# Patient Record
Sex: Male | Born: 1935 | Race: White | Hispanic: No | Marital: Single | Smoking: Former smoker
Health system: Southern US, Community
[De-identification: ages and names within clinical notes are randomized; demographics above are authoritative.]

## PROBLEM LIST (undated history)

## (undated) DIAGNOSIS — E119 Type 2 diabetes mellitus without complications: Secondary | ICD-10-CM

## (undated) DIAGNOSIS — I10 Essential (primary) hypertension: Secondary | ICD-10-CM

## (undated) DIAGNOSIS — K529 Noninfective gastroenteritis and colitis, unspecified: Secondary | ICD-10-CM

## (undated) DIAGNOSIS — K259 Gastric ulcer, unspecified as acute or chronic, without hemorrhage or perforation: Secondary | ICD-10-CM

---

## 2005-12-26 ENCOUNTER — Emergency Department (HOSPITAL_COMMUNITY): Admission: EM | Admit: 2005-12-26 | Discharge: 2005-12-26 | Payer: Self-pay | Admitting: Emergency Medicine

## 2007-01-11 ENCOUNTER — Emergency Department (HOSPITAL_COMMUNITY): Admission: EM | Admit: 2007-01-11 | Discharge: 2007-01-11 | Payer: Self-pay | Admitting: Emergency Medicine

## 2009-01-03 ENCOUNTER — Emergency Department (HOSPITAL_COMMUNITY): Admission: EM | Admit: 2009-01-03 | Discharge: 2009-01-03 | Payer: Self-pay | Admitting: Emergency Medicine

## 2010-07-10 ENCOUNTER — Inpatient Hospital Stay (INDEPENDENT_AMBULATORY_CARE_PROVIDER_SITE_OTHER)
Admission: RE | Admit: 2010-07-10 | Discharge: 2010-07-10 | Disposition: A | Discharge status: Home / Self Care | Payer: Medicare Other | Source: Ambulatory Visit | Attending: Family Medicine | Admitting: Family Medicine

## 2010-07-10 DIAGNOSIS — H60399 Other infective otitis externa, unspecified ear: Secondary | ICD-10-CM

## 2010-07-10 DIAGNOSIS — J309 Allergic rhinitis, unspecified: Secondary | ICD-10-CM

## 2012-06-03 ENCOUNTER — Other Ambulatory Visit (HOSPITAL_COMMUNITY): Payer: Self-pay | Admitting: *Deleted

## 2012-06-04 ENCOUNTER — Encounter (HOSPITAL_COMMUNITY)
Admission: RE | Admit: 2012-06-04 | Discharge: 2012-06-04 | Disposition: A | Discharge status: Home / Self Care | Payer: Medicare Other | Source: Ambulatory Visit | Attending: Gastroenterology | Admitting: Gastroenterology

## 2012-06-04 DIAGNOSIS — M069 Rheumatoid arthritis, unspecified: Secondary | ICD-10-CM | POA: Insufficient documentation

## 2012-06-04 MED ORDER — DIPHENHYDRAMINE HCL 50 MG/ML IJ SOLN
INTRAMUSCULAR | Status: AC
  Administered 2012-06-04: 25 mg via INTRAVENOUS
  Filled 2012-06-04: qty 1

## 2012-06-04 MED ORDER — DIPHENHYDRAMINE HCL 50 MG/ML IJ SOLN
25.0000 mg | Freq: Once | INTRAMUSCULAR | Status: AC
  Administered 2012-06-04: 25 mg via INTRAVENOUS

## 2012-06-04 MED ORDER — ACETAMINOPHEN 325 MG PO TABS
650.0000 mg | ORAL_TABLET | Freq: Once | ORAL | Status: AC
  Administered 2012-06-04: 650 mg via ORAL

## 2012-06-04 MED ORDER — ACETAMINOPHEN 325 MG PO TABS
ORAL_TABLET | ORAL | Status: AC
  Administered 2012-06-04: 650 mg via ORAL
  Filled 2012-06-04: qty 2

## 2012-06-04 MED ORDER — SODIUM CHLORIDE 0.9 % IV SOLN
5.0000 mg/kg | Freq: Once | INTRAVENOUS | Status: AC
  Administered 2012-06-04: 400 mg via INTRAVENOUS
  Filled 2012-06-04: qty 40

## 2012-06-04 MED ORDER — SODIUM CHLORIDE 0.9 % IV SOLN
Freq: Once | INTRAVENOUS | Status: AC
  Administered 2012-06-04: 10:00:00 via INTRAVENOUS

## 2012-06-16 ENCOUNTER — Other Ambulatory Visit (HOSPITAL_COMMUNITY): Payer: Self-pay | Admitting: *Deleted

## 2012-06-16 ENCOUNTER — Other Ambulatory Visit (HOSPITAL_COMMUNITY): Payer: Self-pay | Admitting: Cardiology

## 2012-07-02 ENCOUNTER — Other Ambulatory Visit (HOSPITAL_COMMUNITY): Payer: Self-pay

## 2012-07-03 ENCOUNTER — Encounter (HOSPITAL_COMMUNITY)
Admission: RE | Admit: 2012-07-03 | Discharge: 2012-07-03 | Disposition: A | Discharge status: Home / Self Care | Payer: Medicare Other | Source: Ambulatory Visit | Attending: Gastroenterology | Admitting: Gastroenterology

## 2012-07-03 DIAGNOSIS — M069 Rheumatoid arthritis, unspecified: Secondary | ICD-10-CM | POA: Insufficient documentation

## 2012-07-03 LAB — COMPREHENSIVE METABOLIC PANEL
ALT: 22 U/L (ref 0–53)
Alkaline Phosphatase: 50 U/L (ref 39–117)
Chloride: 104 mEq/L (ref 96–112)
Creatinine, Ser: 1.06 mg/dL (ref 0.50–1.35)
Potassium: 4.1 mEq/L (ref 3.5–5.1)

## 2012-07-03 LAB — CBC WITH DIFFERENTIAL/PLATELET
Basophils Absolute: 0 10*3/uL (ref 0.0–0.1)
Eosinophils Absolute: 0 10*3/uL (ref 0.0–0.7)
HCT: 39 % (ref 39.0–52.0)
Lymphs Abs: 0.7 10*3/uL (ref 0.7–4.0)
MCH: 31.7 pg (ref 26.0–34.0)
MCV: 93.5 fL (ref 78.0–100.0)
Neutro Abs: 2.4 10*3/uL (ref 1.7–7.7)
Platelets: 229 10*3/uL (ref 150–400)
RDW: 14.7 % (ref 11.5–15.5)

## 2012-07-03 LAB — C-REACTIVE PROTEIN: CRP: <0.5 mg/dL — ABNORMAL LOW (ref <0.60)

## 2012-07-03 LAB — SEDIMENTATION RATE: Sed Rate: 0 mm/hr (ref 0–16)

## 2012-07-03 MED ORDER — ACETAMINOPHEN 325 MG PO TABS
650.0000 mg | ORAL_TABLET | Freq: Once | ORAL | Status: AC
  Administered 2012-07-03: 650 mg via ORAL

## 2012-07-03 MED ORDER — ACETAMINOPHEN 325 MG PO TABS
ORAL_TABLET | ORAL | Status: AC
  Administered 2012-07-03: 650 mg via ORAL
  Filled 2012-07-03: qty 2

## 2012-07-03 MED ORDER — SODIUM CHLORIDE 0.9 % IV SOLN
Freq: Once | INTRAVENOUS | Status: AC
  Administered 2012-07-03: 250 mL via INTRAVENOUS

## 2012-07-03 MED ORDER — DIPHENHYDRAMINE HCL 50 MG/ML IJ SOLN
INTRAMUSCULAR | Status: AC
  Administered 2012-07-03: 25 mg via INTRAVENOUS
  Filled 2012-07-03: qty 1

## 2012-07-03 MED ORDER — SODIUM CHLORIDE 0.9 % IV SOLN
5.0000 mg/kg | Freq: Once | INTRAVENOUS | Status: AC
  Administered 2012-07-03: 400 mg via INTRAVENOUS
  Filled 2012-07-03: qty 40

## 2012-07-03 MED ORDER — DIPHENHYDRAMINE HCL 50 MG/ML IJ SOLN
25.0000 mg | Freq: Once | INTRAMUSCULAR | Status: AC
  Administered 2012-07-03: 25 mg via INTRAVENOUS

## 2012-07-29 ENCOUNTER — Emergency Department (HOSPITAL_COMMUNITY): Payer: Medicare Other

## 2012-07-29 ENCOUNTER — Emergency Department (HOSPITAL_COMMUNITY)
Admission: EM | Admit: 2012-07-29 | Discharge: 2012-07-29 | Disposition: A | Discharge status: Home / Self Care | Payer: Medicare Other | Attending: Emergency Medicine | Admitting: Emergency Medicine

## 2012-07-29 ENCOUNTER — Encounter (HOSPITAL_COMMUNITY): Payer: Self-pay | Admitting: Emergency Medicine

## 2012-07-29 DIAGNOSIS — Z79899 Other long term (current) drug therapy: Secondary | ICD-10-CM | POA: Insufficient documentation

## 2012-07-29 DIAGNOSIS — M546 Pain in thoracic spine: Secondary | ICD-10-CM | POA: Insufficient documentation

## 2012-07-29 DIAGNOSIS — R109 Unspecified abdominal pain: Secondary | ICD-10-CM | POA: Insufficient documentation

## 2012-07-29 DIAGNOSIS — Z8719 Personal history of other diseases of the digestive system: Secondary | ICD-10-CM | POA: Insufficient documentation

## 2012-07-29 DIAGNOSIS — M549 Dorsalgia, unspecified: Secondary | ICD-10-CM

## 2012-07-29 DIAGNOSIS — M7989 Other specified soft tissue disorders: Secondary | ICD-10-CM

## 2012-07-29 HISTORY — DX: Gastric ulcer, unspecified as acute or chronic, without hemorrhage or perforation: K25.9

## 2012-07-29 LAB — CBC WITH DIFFERENTIAL/PLATELET
Basophils Absolute: 0 10*3/uL (ref 0.0–0.1)
Eosinophils Absolute: 0.1 10*3/uL (ref 0.0–0.7)
Hemoglobin: 13.3 g/dL (ref 13.0–17.0)
Lymphs Abs: 0.7 10*3/uL (ref 0.7–4.0)
MCHC: 35.3 g/dL (ref 30.0–36.0)
MCV: 93.3 fL (ref 78.0–100.0)
Monocytes Absolute: 0.6 10*3/uL (ref 0.1–1.0)
Monocytes Relative: 14 % — ABNORMAL HIGH (ref 3–12)
RBC: 4.04 MIL/uL — ABNORMAL LOW (ref 4.22–5.81)
WBC: 4.2 10*3/uL (ref 4.0–10.5)

## 2012-07-29 LAB — COMPREHENSIVE METABOLIC PANEL
ALT: 21 U/L (ref 0–53)
Albumin: 3.6 g/dL (ref 3.5–5.2)
Alkaline Phosphatase: 62 U/L (ref 39–117)
Creatinine, Ser: 1.21 mg/dL (ref 0.50–1.35)
Total Protein: 6.2 g/dL (ref 6.0–8.3)

## 2012-07-29 LAB — URINALYSIS, ROUTINE W REFLEX MICROSCOPIC
Bilirubin Urine: NEGATIVE
Hgb urine dipstick: NEGATIVE
Ketones, ur: NEGATIVE mg/dL
Protein, ur: NEGATIVE mg/dL
Urobilinogen, UA: 0.2 mg/dL (ref 0.0–1.0)

## 2012-07-29 LAB — D-DIMER, QUANTITATIVE: D-Dimer, Quant: 1.84 ug/mL-FEU — ABNORMAL HIGH (ref 0.00–0.48)

## 2012-07-29 LAB — URINE MICROSCOPIC-ADD ON

## 2012-07-29 LAB — LIPASE, BLOOD: Lipase: 44 U/L (ref 11–59)

## 2012-07-29 LAB — PRO B NATRIURETIC PEPTIDE: Pro B Natriuretic peptide (BNP): 114.3 pg/mL (ref 0–125)

## 2012-07-29 MED ORDER — HYDROCODONE-ACETAMINOPHEN 5-325 MG PO TABS: 2.0000 | ORAL_TABLET | ORAL | Status: DC | PRN

## 2012-07-29 MED ORDER — IOHEXOL 350 MG/ML SOLN
100.0000 mL | Freq: Once | INTRAVENOUS | Status: AC | PRN
  Administered 2012-07-29: 100 mL via INTRAVENOUS

## 2012-07-29 NOTE — ED Notes (Signed)
Pt alert and mentating appropriately. Pt given d/c teaching, prescription and follow up care instructions. Pt verbalizes understanding and has no further questions upon d/c. Pt ambulatory upon d/c. Pt leaving by self.

## 2012-07-29 NOTE — ED Notes (Signed)
Pt returned from vascular. Transporter states radiology states not ready for pt in CT at this time. Pt placed back on monitor. NAD noted at this time. Pt alert and mentating appropriately.

## 2012-07-29 NOTE — Progress Notes (Signed)
VASCULAR LAB PRELIMINARY  PRELIMINARY  PRELIMINARY  PRELIMINARY  Left lower extremity venous duplex completed.    Preliminary report:  Left:  No evidence of DVT, superficial thrombosis, or Baker's cyst.  Serenity Batley, RVT 07/29/2012, 11:47 AM

## 2012-07-29 NOTE — ED Notes (Signed)
Dr. Rancour at bedside. 

## 2012-07-29 NOTE — ED Provider Notes (Signed)
History    CSN: [id number] Arrival date & time 07/29/12  7654  First MD Initiated Contact with Patient 07/29/12 0845     Chief Complaint  Patient presents with  . Shoulder Pain  . Abdominal Pain   (Consider location/radiation/quality/duration/timing/severity/associated sxs/prior Treatment) HPI Comments: Patient presents with a four-day history of right paraspinal thoracic back pain. This is worse with movement of his right arm and worse with bending over. He denies any injury. He is wondering if he has an ulcer as he has had one in the past. He is pain with taking a deep breath. The pain radiates to his epigastrium when he moves a certain way. Denies any chest pain or shortness of breath. He has a history of colitis and is visiting from [place]. His last Remicade treatment with the end of June. Denies any cardiac history. No stents. Denies any shortness of breath or vomiting. Good PO intake and urine output.   The history is provided by the patient.   Past Medical History  Diagnosis Date  . Stomach ulcer    History reviewed. No pertinent past surgical history. History reviewed. No pertinent family history. History  Substance Use Topics  . Smoking status: Not on file  . Smokeless tobacco: Not on file  . Alcohol Use: Not on file    Review of Systems  Constitutional: Negative for activity change and appetite change.  HENT: Negative for congestion and rhinorrhea.   Respiratory: Negative for cough, chest tightness and shortness of breath.   Cardiovascular: Negative for chest pain.  Gastrointestinal: Positive for abdominal pain. Negative for nausea and vomiting.  Genitourinary: Negative for dysuria and hematuria.  Musculoskeletal: Positive for back pain.  Skin: Negative for rash.  Neurological: Negative for dizziness, weakness and headaches.  A complete 10 system review of systems was obtained and all systems are negative except as noted in the HPI and PMH.    Allergies   Keflex  Home Medications   Current Outpatient Rx  Name  Route  Sig  Dispense  Refill  . enalapril (VASOTEC) 5 MG tablet   Oral   Take 5 mg by mouth 2 (two) times daily.         . fenofibrate (TRICOR) 145 MG tablet   Oral   Take 145 mg by mouth daily.         . formoterol (FORADIL) 12 MCG capsule for inhaler   Inhalation   Place 12 mcg into inhaler and inhale daily.         Marland Kitchen glimepiride (AMARYL) 1 MG tablet   Oral   Take 1 mg by mouth daily with supper.         . levothyroxine (LEVOXYL) 75 MCG tablet   Oral   Take 75 mcg by mouth daily before breakfast.         . magnesium oxide (MAG-OX) 400 MG tablet   Oral   Take 400 mg by mouth daily.         . mesalamine (APRISO) 0.375 G 24 hr capsule   Oral   Take 375 mg by mouth 4 (four) times daily.         . metFORMIN (GLUCOPHAGE) 850 MG tablet   Oral   Take 850 mg by mouth 2 (two) times daily with a meal.         . montelukast (SINGULAIR) 10 MG tablet   Oral   Take 10 mg by mouth at bedtime.         Marland Kitchen  pioglitazone (ACTOS) 15 MG tablet   Oral   Take 15 mg by mouth every morning.         Marland Kitchen PRESCRIPTION MEDICATION   Topical   Apply 1 application topically daily as needed (Itching). Halbetasol propionate 0.05%         . simvastatin (ZOCOR) 20 MG tablet   Oral   Take 20 mg by mouth every evening.         . Testosterone (ANDROGEL TD)   Transdermal   Place 1 application onto the skin daily. Apply to shoulders         . HYDROcodone-acetaminophen (NORCO/VICODIN) 5-325 MG per tablet   Oral   Take 2 tablets by mouth every 4 (four) hours as needed for pain.   10 tablet   0    BP 153/80  Pulse 62  Temp(Src) 97.8 F (36.6 C) (Oral)  Resp 16  SpO2 97% Physical Exam  Constitutional: He is oriented to person, place, and time. He appears well-developed and well-nourished. No distress.  HENT:  Head: Normocephalic and atraumatic.  Mouth/Throat: Oropharynx is clear and moist. No oropharyngeal  exudate.  Eyes: Conjunctivae and EOM are normal. Pupils are equal, round, and reactive to light.  Neck: Normal range of motion. Neck supple.  Cardiovascular: Normal rate, regular rhythm and normal heart sounds.   No murmur heard. Pulmonary/Chest: Effort normal and breath sounds normal. No respiratory distress.  Abdominal: Soft. There is no tenderness. There is no rebound and no guarding.  Musculoskeletal: Normal range of motion. He exhibits tenderness. He exhibits no edema.  R paraspinal thoracic tenderness Worse with palpation, worse with R arm movement. Asymmetric LE swelling, L>R.  Intact DP and PT pulses  Neurological: He is alert and oriented to person, place, and time. No cranial nerve deficit. He exhibits normal muscle tone. Coordination normal.  CN 2-12 intact, no ataxia on finger to nose, no nystagmus, 5/5 strength throughout, no pronator drift, Romberg negative, normal gait.   Skin: Skin is warm.    ED Course  Procedures (including critical care time) Labs Reviewed  CBC WITH DIFFERENTIAL - Abnormal; Notable for the following:    RBC 4.04 (*)    HCT 37.7 (*)    Monocytes Relative 14 (*)    All other components within normal limits  COMPREHENSIVE METABOLIC PANEL - Abnormal; Notable for the following:    Glucose, Bld 250 (*)    BUN 26 (*)    GFR calc non Af Amer 57 (*)    GFR calc Af Amer 66 (*)    All other components within normal limits  D-DIMER, QUANTITATIVE - Abnormal; Notable for the following:    D-Dimer, Quant 1.84 (*)    All other components within normal limits  URINALYSIS, ROUTINE W REFLEX MICROSCOPIC - Abnormal; Notable for the following:    Glucose, UA >1000 (*)    All other components within normal limits  LIPASE, BLOOD  PRO B NATRIURETIC PEPTIDE  TROPONIN I  URINE MICROSCOPIC-ADD ON  TROPONIN I   Dg Abd Acute W/chest  07/29/2012   *RADIOLOGY REPORT*  Clinical Data: Right shoulder blade pain.  Leg swelling.  ACUTE ABDOMEN SERIES (ABDOMEN 2 VIEW & CHEST  1 VIEW)  Comparison: PA and lateral chest 01/11/2007.  Findings: Small focus of linear scar is seen in the right upper lobe and left lower lobe.  The lungs are otherwise clear.  No pneumothorax or pleural fluid is identified.  Two views of the abdomen show no free intraperitoneal  air.  The bowel gas pattern is unremarkable.  No abnormal abdominal calcification is seen.  Multilevel disc lumbar degenerative change in mild convex left scoliosis are noted.  IMPRESSION: No acute finding chest or abdomen.   Original Report Authenticated By: Orlean Patten, M.D.   Ct Angio Chest Aortic Dissect W &/or W/o  07/29/2012   *RADIOLOGY REPORT*  Clinical Data:  Back, epigastric and shoulder pain.  CT ANGIOGRAPHY CHEST, ABDOMEN AND PELVIS  Technique:  Multidetector CT imaging through the chest, abdomen and pelvis was performed using the standard protocol during bolus administration of intravenous contrast.  Multiplanar reconstructed images including MIPs were obtained and reviewed to evaluate the vascular anatomy.  Contrast: 169m OMNIPAQUE IOHEXOL 350 MG/ML SOLN  Comparison:  CTA of the chest on 12/26/2005.  CTA CHEST  Findings:  The thoracic aorta is of normal caliber and demonstrates no evidence of aneurysmal disease or dissection.  Proximal great vessels show normal branching pattern and patency.  There is some calcified plaque at the origin of the left subclavian artery which is not causing significant stenosis.  There is evidence of coronary atherosclerosis with calcified plaque noted at the level of the LAD and also a prominent diagonal branch. Calcified plaque is also suspected in the distribution of the RCA. However, the right coronary artery is blurred by motion artifact.  The pulmonary arteries are also well opacified on the study and there is no evidence of pulmonary embolism.  The heart size is at the upper limits of normal.  No pleural or pericardial fluid is identified.  Lungs show scattered areas of atelectasis  without focal consolidation, pulmonary nodule or edema.  No enlarged lymph nodes are seen.  The bony thorax shows diffuse osteophyte formation throughout the thoracic spine.  No fractures or bony lesions are seen.   Review of the MIP images confirms the above findings.  IMPRESSION:  1.  No evidence of aortic aneurysmal disease or dissection. 2.  No evidence of pulmonary embolism. 3.  Evidence of coronary atherosclerosis with calcified plaque identified by CT in the distribution of the LAD and right coronary artery.  CTA ABDOMEN AND PELVIS  Findings:  The abdominal aorta is well opacified and of normal caliber.  There is no evidence of aneurysmal disease or dissection. A mild amount of scattered calcified plaque is present.  There are separate origins of a common hepatic artery and splenic artery off of the aorta.  Both show normal patency.  The left gastric artery emanates from the proximal splenic artery.  The superior mesenteric artery shows normal patency.  A single right renal artery demonstrates normal patency.  Calcified plaque at the origin of the left renal artery causes approximately 30 - 40% stenosis.  The artery bifurcates almost immediately beyond its origin.  The inferior mesenteric artery is normally patent.  The iliac arteries are tortuous bilaterally and show no evidence of aneurysmal disease or significant stenosis.  The common femoral arteries are normally patent.  Femoral bifurcations also show normal patency.  The liver, gallbladder, pancreas, spleen and adrenal glands are unremarkable on arterial phase of contrast opacification.  There is a dominant posterior left upper pole renal cyst measuring 8.7 cm in greatest diameter.  The internal density is consistent with simple fluid.  Smaller lower pole cysts are present on the left.  There are several benign cysts on the right with the largest emanating from the posterior lower pole and measuring 5.2 cm in greatest diameter.  Hernia mesh is present  in  the left inguinal region without evidence of recurrent inguinal hernia.  No abnormal masses or enlarged lymph nodes are seen.  No abnormal fluid collections are identified. Diffuse diverticulosis of the colon is noted without evidence of diverticulitis.  The bladder is unremarkable.  The prostate gland is moderately enlarged with maximal diameter of 6.6 cm.  Diffuse degenerative changes are seen involving the spine without evidence of fracture or focal bony lesion.   Review of the MIP images confirms the above findings.  IMPRESSION:  1.  No evidence of abdominal aortic aneurysm or dissection. 2.  Mild narrowing of the proximal left renal artery which does not appear significant. 3.  Bilateral simple renal cysts including a dominant left renal cyst measuring nearly 9 cm. 4.  Left inguinal hernia mesh without evidence of recurrent hernia. 5.  Prostatic enlargement.   Original Report Authenticated By: Aletta Edouard, M.D.   Ct Angio Abd/pel W/ And/or W/o  07/29/2012   *RADIOLOGY REPORT*  Clinical Data:  Back, epigastric and shoulder pain.  CT ANGIOGRAPHY CHEST, ABDOMEN AND PELVIS  Technique:  Multidetector CT imaging through the chest, abdomen and pelvis was performed using the standard protocol during bolus administration of intravenous contrast.  Multiplanar reconstructed images including MIPs were obtained and reviewed to evaluate the vascular anatomy.  Contrast: 179m OMNIPAQUE IOHEXOL 350 MG/ML SOLN  Comparison:  CTA of the chest on 12/26/2005.  CTA CHEST  Findings:  The thoracic aorta is of normal caliber and demonstrates no evidence of aneurysmal disease or dissection.  Proximal great vessels show normal branching pattern and patency.  There is some calcified plaque at the origin of the left subclavian artery which is not causing significant stenosis.  There is evidence of coronary atherosclerosis with calcified plaque noted at the level of the LAD and also a prominent diagonal branch. Calcified plaque is  also suspected in the distribution of the RCA. However, the right coronary artery is blurred by motion artifact.  The pulmonary arteries are also well opacified on the study and there is no evidence of pulmonary embolism.  The heart size is at the upper limits of normal.  No pleural or pericardial fluid is identified.  Lungs show scattered areas of atelectasis without focal consolidation, pulmonary nodule or edema.  No enlarged lymph nodes are seen.  The bony thorax shows diffuse osteophyte formation throughout the thoracic spine.  No fractures or bony lesions are seen.   Review of the MIP images confirms the above findings.  IMPRESSION:  1.  No evidence of aortic aneurysmal disease or dissection. 2.  No evidence of pulmonary embolism. 3.  Evidence of coronary atherosclerosis with calcified plaque identified by CT in the distribution of the LAD and right coronary artery.  CTA ABDOMEN AND PELVIS  Findings:  The abdominal aorta is well opacified and of normal caliber.  There is no evidence of aneurysmal disease or dissection. A mild amount of scattered calcified plaque is present.  There are separate origins of a common hepatic artery and splenic artery off of the aorta.  Both show normal patency.  The left gastric artery emanates from the proximal splenic artery.  The superior mesenteric artery shows normal patency.  A single right renal artery demonstrates normal patency.  Calcified plaque at the origin of the left renal artery causes approximately 30 - 40% stenosis.  The artery bifurcates almost immediately beyond its origin.  The inferior mesenteric artery is normally patent.  The iliac arteries are tortuous bilaterally and show no evidence of  aneurysmal disease or significant stenosis.  The common femoral arteries are normally patent.  Femoral bifurcations also show normal patency.  The liver, gallbladder, pancreas, spleen and adrenal glands are unremarkable on arterial phase of contrast opacification.  There is  a dominant posterior left upper pole renal cyst measuring 8.7 cm in greatest diameter.  The internal density is consistent with simple fluid.  Smaller lower pole cysts are present on the left.  There are several benign cysts on the right with the largest emanating from the posterior lower pole and measuring 5.2 cm in greatest diameter.  Hernia mesh is present in the left inguinal region without evidence of recurrent inguinal hernia.  No abnormal masses or enlarged lymph nodes are seen.  No abnormal fluid collections are identified. Diffuse diverticulosis of the colon is noted without evidence of diverticulitis.  The bladder is unremarkable.  The prostate gland is moderately enlarged with maximal diameter of 6.6 cm.  Diffuse degenerative changes are seen involving the spine without evidence of fracture or focal bony lesion.   Review of the MIP images confirms the above findings.  IMPRESSION:  1.  No evidence of abdominal aortic aneurysm or dissection. 2.  Mild narrowing of the proximal left renal artery which does not appear significant. 3.  Bilateral simple renal cysts including a dominant left renal cyst measuring nearly 9 cm. 4.  Left inguinal hernia mesh without evidence of recurrent hernia. 5.  Prostatic enlargement.   Original Report Authenticated By: Aletta Edouard, M.D.   1. Back pain     MDM  3 days of right paraspinal back pain that is worse with movement worse with deep breathing. Denies injury.  Asymmetric left lower extremity with swelling.  Patient states this is chronic from previous surgeries. Abdomen is soft and nontender. Equal pulses throughout, no neuro deficifts Right paraspinal back pain is worse with palpation worse with right arm movement. Equal grip strength bilaterally. Equal radial pulses.  EKG nonischemic.  CXR negative.  Troponin negative.  No evidence of free air or obstruction. Doppler negative for DVT. Given location of back pain, concern for dissection or PE given recent  travel.  Equal peripheral pulses, no neuro deficits.   There is no evidence of PE or aortic dissection on imaging. Delta troponin negative. Patient refusing pain medication. Suspect musculoskeletal back pain. Will treat with pain meds.  No NSAIDs given history of ulcer. Return precautions discussed.   Date: 07/29/2012  Rate: 80  Rhythm: normal sinus rhythm  QRS Axis: normal  Intervals: normal  ST/T Wave abnormalities: normal  Conduction Disutrbances:none  Narrative Interpretation:   Old EKG Reviewed: none available   BP 153/80  Pulse 62  Temp(Src) 97.8 F (36.6 C) (Oral)  Resp 16  SpO2 97%      Ezequiel Essex, MD 07/29/12 1541

## 2012-07-29 NOTE — ED Notes (Signed)
Pt states pain started in epigastric area where he has had ulcer before last Friday. Pt states pain started in right shoulder. Pt states was taking Protonix and thought it was helping. Pt states woke up this morning and had trouble taking deep breath. Pt states pain increase with bending over. Pt denies n/v/d.

## 2012-12-18 ENCOUNTER — Other Ambulatory Visit (HOSPITAL_COMMUNITY): Payer: Self-pay | Admitting: *Deleted

## 2012-12-21 ENCOUNTER — Encounter (HOSPITAL_COMMUNITY)
Admission: RE | Admit: 2012-12-21 | Discharge: 2012-12-21 | Disposition: A | Discharge status: Home / Self Care | Payer: Medicare Other | Source: Ambulatory Visit | Attending: Gastroenterology | Admitting: Gastroenterology

## 2012-12-21 DIAGNOSIS — K5190 Ulcerative colitis, unspecified, without complications: Secondary | ICD-10-CM | POA: Insufficient documentation

## 2012-12-21 MED ORDER — DIPHENHYDRAMINE HCL 50 MG/ML IJ SOLN: 25.0000 mg | INTRAMUSCULAR | Status: DC

## 2012-12-21 MED ORDER — SODIUM CHLORIDE 0.9 % IV SOLN
INTRAVENOUS | Status: DC
  Administered 2012-12-21: 250 mL via INTRAVENOUS

## 2012-12-21 MED ORDER — ACETAMINOPHEN 325 MG PO TABS
ORAL_TABLET | ORAL | Status: AC
  Administered 2012-12-21: 650 mg via ORAL
  Filled 2012-12-21: qty 2

## 2012-12-21 MED ORDER — SODIUM CHLORIDE 0.9 % IV SOLN
5.0000 mg/kg | INTRAVENOUS | Status: DC
  Administered 2012-12-21: 400 mg via INTRAVENOUS
  Filled 2012-12-21: qty 40

## 2012-12-21 MED ORDER — DIPHENHYDRAMINE HCL 50 MG/ML IJ SOLN
INTRAMUSCULAR | Status: AC
  Administered 2012-12-21: 25 mg via INTRAVENOUS
  Filled 2012-12-21: qty 1

## 2012-12-21 MED ORDER — ACETAMINOPHEN 325 MG PO TABS
650.0000 mg | ORAL_TABLET | ORAL | Status: DC
Start: 2012-12-21 — End: 2012-12-22

## 2012-12-31 ENCOUNTER — Encounter (HOSPITAL_COMMUNITY): Payer: Self-pay | Admitting: Emergency Medicine

## 2012-12-31 ENCOUNTER — Emergency Department (HOSPITAL_COMMUNITY)
Admission: EM | Admit: 2012-12-31 | Discharge: 2012-12-31 | Disposition: A | Discharge status: Home Health | Payer: Medicare Other | Attending: Emergency Medicine | Admitting: Emergency Medicine

## 2012-12-31 DIAGNOSIS — J019 Acute sinusitis, unspecified: Secondary | ICD-10-CM | POA: Insufficient documentation

## 2012-12-31 DIAGNOSIS — Z9109 Other allergy status, other than to drugs and biological substances: Secondary | ICD-10-CM | POA: Insufficient documentation

## 2012-12-31 DIAGNOSIS — E119 Type 2 diabetes mellitus without complications: Secondary | ICD-10-CM | POA: Insufficient documentation

## 2012-12-31 DIAGNOSIS — Z79899 Other long term (current) drug therapy: Secondary | ICD-10-CM | POA: Insufficient documentation

## 2012-12-31 DIAGNOSIS — Z8719 Personal history of other diseases of the digestive system: Secondary | ICD-10-CM | POA: Insufficient documentation

## 2012-12-31 DIAGNOSIS — J309 Allergic rhinitis, unspecified: Secondary | ICD-10-CM | POA: Insufficient documentation

## 2012-12-31 DIAGNOSIS — J209 Acute bronchitis, unspecified: Secondary | ICD-10-CM | POA: Insufficient documentation

## 2012-12-31 DIAGNOSIS — Z881 Allergy status to other antibiotic agents status: Secondary | ICD-10-CM | POA: Insufficient documentation

## 2012-12-31 DIAGNOSIS — I10 Essential (primary) hypertension: Secondary | ICD-10-CM | POA: Insufficient documentation

## 2012-12-31 DIAGNOSIS — J40 Bronchitis, not specified as acute or chronic: Secondary | ICD-10-CM

## 2012-12-31 DIAGNOSIS — J329 Chronic sinusitis, unspecified: Secondary | ICD-10-CM

## 2012-12-31 HISTORY — DX: Noninfective gastroenteritis and colitis, unspecified: K52.9

## 2012-12-31 MED ORDER — HYDROCOD POLST-CHLORPHEN POLST 10-8 MG/5ML PO LQCR: 5.0000 mL | Freq: Two times a day (BID) | ORAL | Status: DC | PRN

## 2012-12-31 MED ORDER — AZITHROMYCIN 250 MG PO TABS: 250.0000 mg | ORAL_TABLET | Freq: Every day | ORAL | Status: DC

## 2012-12-31 NOTE — ED Provider Notes (Signed)
CSN: [id number]     Arrival date & time 12/31/12  4098 History   First MD Initiated Contact with Patient 12/31/12 0745     Chief Complaint  Patient presents with  . Nasal Congestion   (Consider location/radiation/quality/duration/timing/severity/associated sxs/prior Treatment) HPI Comments: Patient is 77 year old male with PMHx significant for HTN, DM, allergies who presents to the ED with a 7 day history of sinus congestion, nasal congestion, cough with yellow sputum production.  He states that when he leans over the pain in his face worsens.  He denies fever, chills, headache, sore throat, chest pain, shortness of breath, nausea, vomiting, palpitations.  He reports no recent sick contacts.  Patient is a 77 y.o. male presenting with cough. The history is provided by the patient. No language interpreter was used.  Cough Cough characteristics:  Productive Sputum characteristics:  Yellow Severity:  Moderate Onset quality:  Gradual Duration:  7 days Timing:  Constant Chronicity:  New Smoker: no   Context: upper respiratory infection   Context: not animal exposure, not exposure to allergens, not sick contacts, not smoke exposure, not weather changes and not with activity   Relieved by:  Nothing Worsened by:  Nothing tried Ineffective treatments:  None tried Associated symptoms: rhinorrhea and sinus congestion   Associated symptoms: no chest pain, no chills, no ear pain, no eye discharge, no fever, no headaches, no myalgias, no shortness of breath, no sore throat and no wheezing     Past Medical History  Diagnosis Date  . Stomach ulcer   . Colitis    History reviewed. No pertinent past surgical history. No family history on file. History  Substance Use Topics  . Smoking status: Never Smoker   . Smokeless tobacco: Not on file  . Alcohol Use: 1.2 oz/week    2 Shots of liquor per week    Review of Systems  Constitutional: Negative for fever and chills.  HENT: Positive for  rhinorrhea. Negative for ear pain and sore throat.   Eyes: Negative for discharge.  Respiratory: Positive for cough. Negative for shortness of breath and wheezing.   Cardiovascular: Negative for chest pain.  Musculoskeletal: Negative for myalgias.  Neurological: Negative for headaches.  All other systems reviewed and are negative.    Allergies  Keflex  Home Medications   Current Outpatient Rx  Name  Route  Sig  Dispense  Refill  . enalapril (VASOTEC) 5 MG tablet   Oral   Take 5 mg by mouth 2 (two) times daily.         . fenofibrate (TRICOR) 145 MG tablet   Oral   Take 145 mg by mouth daily.         . formoterol (FORADIL) 12 MCG capsule for inhaler   Inhalation   Place 12 mcg into inhaler and inhale daily.         Marland Kitchen glimepiride (AMARYL) 1 MG tablet   Oral   Take 1 mg by mouth daily with supper.         Marland Kitchen HYDROcodone-acetaminophen (NORCO/VICODIN) 5-325 MG per tablet   Oral   Take 2 tablets by mouth every 4 (four) hours as needed for pain.   10 tablet   0   . levothyroxine (LEVOXYL) 75 MCG tablet   Oral   Take 75 mcg by mouth daily before breakfast.         . magnesium oxide (MAG-OX) 400 MG tablet   Oral   Take 400 mg by mouth daily.         Marland Kitchen  mesalamine (APRISO) 0.375 G 24 hr capsule   Oral   Take 375 mg by mouth 4 (four) times daily.         . metFORMIN (GLUCOPHAGE) 850 MG tablet   Oral   Take 850 mg by mouth 2 (two) times daily with a meal.         . montelukast (SINGULAIR) 10 MG tablet   Oral   Take 10 mg by mouth at bedtime.         . pioglitazone (ACTOS) 15 MG tablet   Oral   Take 15 mg by mouth every morning.         Marland Kitchen PRESCRIPTION MEDICATION   Topical   Apply 1 application topically daily as needed (Itching). Halbetasol propionate 0.05%         . simvastatin (ZOCOR) 20 MG tablet   Oral   Take 20 mg by mouth every evening.         . Testosterone (ANDROGEL TD)   Transdermal   Place 1 application onto the skin daily.  Apply to shoulders          BP 136/78  Pulse 71  Temp(Src) 98 F (36.7 C) (Oral)  Resp 18  Ht 5' 7"  (1.702 m)  Wt 200 lb (90.719 kg)  BMI 31.32 kg/m2 Physical Exam  Nursing note and vitals reviewed. Constitutional: He is oriented to person, place, and time. He appears well-developed and well-nourished. No distress.  HENT:  Head: Normocephalic and atraumatic.  Right Ear: External ear normal.  Left Ear: External ear normal.  Mouth/Throat: Oropharynx is clear and moist. No oropharyngeal exudate.  Boggy nasal mucosa, maxillary sinus ttp, frontal sinus non tender  Eyes: Conjunctivae are normal. Pupils are equal, round, and reactive to light. No scleral icterus.  Neck: Normal range of motion. Neck supple.  Cardiovascular: Normal rate, regular rhythm and normal heart sounds.  Exam reveals no gallop and no friction rub.   No murmur heard. Pulmonary/Chest: Effort normal and breath sounds normal. No respiratory distress. He has no wheezes. He has no rales. He exhibits no tenderness.  Abdominal: Soft. Bowel sounds are normal. He exhibits no distension. There is no tenderness.  Musculoskeletal: Normal range of motion. He exhibits no edema and no tenderness.  Lymphadenopathy:    He has no cervical adenopathy.  Neurological: He is alert and oriented to person, place, and time. He exhibits normal muscle tone. Coordination normal.  Skin: Skin is warm and dry. No rash noted. No erythema. No pallor.  Psychiatric: He has a normal mood and affect. His behavior is normal. Judgment and thought content normal.    ED Course  Procedures (including critical care time) Labs Review Labs Reviewed - No data to display Imaging Review No results found.  EKG Interpretation   None       MDM  Bronchitis Sinusitis  Patient here with nasal congestion, cough with yellow sputum production, history of bronchitis - no alarming signs to suggest CAD, respiratory distress.  Reports recent cardiac work up  which was negative.  Will place on zithromycin with cough medication.   Idalia Needle Joelyn Oms, [place] 12/31/12 [phone]

## 2012-12-31 NOTE — ED Notes (Signed)
Pt has had congestion x 7 days

## 2012-12-31 NOTE — ED Notes (Signed)
MD at bedside. 

## 2013-01-01 NOTE — ED Provider Notes (Signed)
Medical screening examination/treatment/procedure(s) were conducted as a shared visit with non-physician practitioner(s) and myself.  I personally evaluated the patient during the encounter.  1 week of cough, congestion, nasal congestion. No fever, chest pain, SOB.  EKG Interpretation   None        Ezequiel Essex, MD 01/01/13 1010

## 2013-01-05 ENCOUNTER — Emergency Department (HOSPITAL_COMMUNITY)
Admission: EM | Admit: 2013-01-05 | Discharge: 2013-01-05 | Disposition: A | Discharge status: Home / Self Care | Payer: Medicare Other | Attending: Emergency Medicine | Admitting: Emergency Medicine

## 2013-01-05 ENCOUNTER — Encounter (HOSPITAL_COMMUNITY): Payer: Self-pay | Admitting: Emergency Medicine

## 2013-01-05 DIAGNOSIS — R0981 Nasal congestion: Secondary | ICD-10-CM

## 2013-01-05 DIAGNOSIS — R062 Wheezing: Secondary | ICD-10-CM | POA: Insufficient documentation

## 2013-01-05 DIAGNOSIS — J069 Acute upper respiratory infection, unspecified: Secondary | ICD-10-CM | POA: Insufficient documentation

## 2013-01-05 DIAGNOSIS — Z79899 Other long term (current) drug therapy: Secondary | ICD-10-CM | POA: Insufficient documentation

## 2013-01-05 DIAGNOSIS — Z87891 Personal history of nicotine dependence: Secondary | ICD-10-CM | POA: Insufficient documentation

## 2013-01-05 DIAGNOSIS — Z881 Allergy status to other antibiotic agents status: Secondary | ICD-10-CM | POA: Insufficient documentation

## 2013-01-05 DIAGNOSIS — Z8719 Personal history of other diseases of the digestive system: Secondary | ICD-10-CM | POA: Insufficient documentation

## 2013-01-05 MED ORDER — PREDNISONE 20 MG PO TABS: 40.0000 mg | ORAL_TABLET | Freq: Every day | ORAL | Status: DC

## 2013-01-05 MED ORDER — FEXOFENADINE-PSEUDOEPHED ER 60-120 MG PO TB12: 1.0000 | ORAL_TABLET | Freq: Two times a day (BID) | ORAL | Status: DC

## 2013-01-05 MED ORDER — LORATADINE 10 MG PO TABS: 10.0000 mg | ORAL_TABLET | Freq: Every day | ORAL | Status: DC | PRN

## 2013-01-05 MED ORDER — PREDNISONE 20 MG PO TABS
40.0000 mg | ORAL_TABLET | Freq: Once | ORAL | Status: AC
  Administered 2013-01-05: 40 mg via ORAL
  Filled 2013-01-05: qty 2

## 2013-01-05 NOTE — ED Provider Notes (Signed)
CSN: [id number]     Arrival date & time 01/05/13  8127 History   First MD Initiated Contact with Patient 01/05/13 1111     Chief Complaint  Patient presents with  . Fatigue   (Consider location/radiation/quality/duration/timing/severity/associated sxs/prior Treatment) HPI  77 year old male with a facial congestion and generalized fatigue. Symptoms have been ongoing for well over a week. He is evaluating the emergency room on December 25. He is a started on azithromycin which he is finished. He is additionally given medication for cough. His cough has improved, but his other symptoms have not. As a sensation of facial pressure that he cannot breathe through his nose. Patrick Carson is nonproductive. He denies any shortness of breath, but he does feel like he is wheezing and "whistling" when he sleeps at night. No fevers or chills. No nausea or vomiting. No urinary complaints.  Past Medical History  Diagnosis Date  . Stomach ulcer   . Colitis    History reviewed. No pertinent past surgical history. No family history on file. History  Substance Use Topics  . Smoking status: Former Research scientist (life sciences)  . Smokeless tobacco: Not on file  . Alcohol Use: 1.2 oz/week    2 Shots of liquor per week    Review of Systems  All systems reviewed and negative, other than as noted in HPI.   Allergies  Keflex  Home Medications   Current Outpatient Rx  Name  Route  Sig  Dispense  Refill  . chlorpheniramine-HYDROcodone (TUSSIONEX PENNKINETIC ER) 10-8 MG/5ML LQCR   Oral   Take 5 mLs by mouth every 12 (twelve) hours as needed for cough.   115 mL   0   . cycloSPORINE (RESTASIS) 0.05 % ophthalmic emulsion   Both Eyes   Place 1 drop into both eyes 2 (two) times daily as needed (dryness).         . enalapril (VASOTEC) 5 MG tablet   Oral   Take 5 mg by mouth 2 (two) times daily.         . fenofibrate (TRICOR) 145 MG tablet   Oral   Take 145 mg by mouth daily.         . formoterol (FORADIL) 12 MCG  capsule for inhaler   Inhalation   Place 12 mcg into inhaler and inhale daily.         Marland Kitchen glimepiride (AMARYL) 1 MG tablet   Oral   Take 1 mg by mouth daily with supper.         . halobetasol (ULTRAVATE) 0.05 % cream   Topical   Apply 1 application topically 2 (two) times daily as needed (itching).         Marland Kitchen levothyroxine (LEVOXYL) 75 MCG tablet   Oral   Take 75 mcg by mouth daily before breakfast.         . metFORMIN (GLUCOPHAGE) 850 MG tablet   Oral   Take 850 mg by mouth 2 (two) times daily with a meal.         . montelukast (SINGULAIR) 10 MG tablet   Oral   Take 10 mg by mouth at bedtime.         . naproxen sodium (ANAPROX) 220 MG tablet   Oral   Take 220 mg by mouth daily as needed (pain).         . pioglitazone (ACTOS) 15 MG tablet   Oral   Take 15 mg by mouth every morning.         Marland Kitchen  simvastatin (ZOCOR) 20 MG tablet   Oral   Take 20 mg by mouth every evening.         . tamsulosin (FLOMAX) 0.4 MG CAPS capsule   Oral   Take 0.4 mg by mouth daily.         . Testosterone (ANDROGEL TD)   Transdermal   Place 1 application onto the skin daily. Apply to shoulders         . triamcinolone cream (KENALOG) 0.1 %   Topical   Apply 1 application topically 2 (two) times daily as needed (rash).          BP 146/71  Pulse 75  Temp(Src) 97.8 F (36.6 C) (Oral)  Resp 15  SpO2 96% Physical Exam  Nursing note and vitals reviewed. Constitutional: He appears well-developed and well-nourished. No distress.  HENT:  Head: Normocephalic and atraumatic.  Right Ear: External ear normal.  Left Ear: External ear normal.  Mouth/Throat: Oropharynx is clear and moist.  Boggy nasal mucosa  Eyes: Conjunctivae and EOM are normal. Pupils are equal, round, and reactive to light. Right eye exhibits no discharge. Left eye exhibits no discharge.  Neck: Neck supple.  Cardiovascular: Normal rate, regular rhythm and normal heart sounds.  Exam reveals no gallop and no  friction rub.   No murmur heard. Pulmonary/Chest: Effort normal and breath sounds normal. No respiratory distress.  Abdominal: Soft. He exhibits no distension. There is no tenderness.  Musculoskeletal: He exhibits no edema and no tenderness.  Neurological: He is alert.  Skin: Skin is warm and dry. He is not diaphoretic.  Psychiatric: He has a normal mood and affect. His behavior is normal. Thought content normal.    ED Course  Procedures (including critical care time) Labs Review Labs Reviewed - No data to display Imaging Review No results found.  EKG Interpretation   None       MDM   1. Sinus congestion   2. Viral URI with cough    77 year old male with congestion and generalized fatigue. Symptoms are consistent with a viral upper respiratory infection. His cough has improved since his last evaluation but his other symptoms have persisted. He sounds clear to me on exam has no increased work of breathing, he does endorse that he sounds wheezy at night though. The day course of steroids is reasonable to try. He seems frustrated that his symptoms have lingered on for so long. Discussed with patient that this is typical though. Will try decongestants for potential further symptomatic relief. Return precautions were discussed. Outpatient followup otherwise.    Virgel Manifold, MD 01/10/13 1745

## 2013-01-05 NOTE — ED Notes (Signed)
Pt comfortable with d/c and f/u instructions. Prescriptions x2.

## 2013-01-05 NOTE — ED Notes (Signed)
Dr. Kohut at bedside 

## 2013-01-05 NOTE — ED Notes (Signed)
Pt was here on christmas and placed on zpack and cough medication.  Cough has improved but continues to have headache and fatigue.  No chest pain or sob.  Pt was diagnosed with bronchitis

## 2013-06-11 ENCOUNTER — Other Ambulatory Visit (HOSPITAL_COMMUNITY): Payer: Self-pay | Admitting: *Deleted

## 2013-06-14 ENCOUNTER — Encounter (HOSPITAL_COMMUNITY)
Admission: RE | Admit: 2013-06-14 | Discharge: 2013-06-14 | Disposition: A | Discharge status: Home / Self Care | Payer: Medicare Other | Source: Ambulatory Visit | Attending: Gastroenterology | Admitting: Gastroenterology

## 2013-06-14 DIAGNOSIS — K5190 Ulcerative colitis, unspecified, without complications: Secondary | ICD-10-CM | POA: Diagnosis not present

## 2013-06-14 MED ORDER — ACETAMINOPHEN 325 MG PO TABS
ORAL_TABLET | ORAL | Status: AC
  Filled 2013-06-14: qty 2

## 2013-06-14 MED ORDER — DIPHENHYDRAMINE HCL 50 MG/ML IJ SOLN
INTRAMUSCULAR | Status: AC
  Filled 2013-06-14: qty 1

## 2013-06-14 MED ORDER — ACETAMINOPHEN 325 MG PO TABS
650.0000 mg | ORAL_TABLET | ORAL | Status: DC
  Administered 2013-06-14: 650 mg via ORAL

## 2013-06-14 MED ORDER — SODIUM CHLORIDE 0.9 % IV SOLN
5.0000 mg/kg | INTRAVENOUS | Status: DC
  Administered 2013-06-14: 500 mg via INTRAVENOUS
  Filled 2013-06-14: qty 50

## 2013-06-14 MED ORDER — SODIUM CHLORIDE 0.9 % IV SOLN
INTRAVENOUS | Status: DC
  Administered 2013-06-14: 09:00:00 via INTRAVENOUS

## 2013-06-14 MED ORDER — DIPHENHYDRAMINE HCL 50 MG/ML IJ SOLN
25.0000 mg | INTRAMUSCULAR | Status: DC
  Administered 2013-06-14: 25 mg via INTRAVENOUS

## 2013-12-14 ENCOUNTER — Other Ambulatory Visit (HOSPITAL_COMMUNITY): Payer: Self-pay | Admitting: *Deleted

## 2013-12-15 ENCOUNTER — Ambulatory Visit (HOSPITAL_COMMUNITY)
Admission: RE | Admit: 2013-12-15 | Discharge: 2013-12-15 | Disposition: A | Discharge status: Home / Self Care | Payer: Medicare Other | Source: Ambulatory Visit | Attending: Gastroenterology | Admitting: Gastroenterology

## 2013-12-15 DIAGNOSIS — K5190 Ulcerative colitis, unspecified, without complications: Secondary | ICD-10-CM | POA: Diagnosis not present

## 2013-12-15 LAB — COMPREHENSIVE METABOLIC PANEL
ALBUMIN: 3.6 g/dL (ref 3.5–5.2)
ALK PHOS: 50 U/L (ref 39–117)
ALT: 23 U/L (ref 0–53)
ANION GAP: 15 (ref 5–15)
AST: 34 U/L (ref 0–37)
BUN: 30 mg/dL — AB (ref 6–23)
CALCIUM: 9.6 mg/dL (ref 8.4–10.5)
CO2: 19 mEq/L (ref 19–32)
Chloride: 105 mEq/L (ref 96–112)
Creatinine, Ser: 1.06 mg/dL (ref 0.50–1.35)
GFR calc non Af Amer: 67 mL/min — ABNORMAL LOW (ref >90)
GFR, EST AFRICAN AMERICAN: 77 mL/min — AB (ref >90)
Glucose, Bld: 181 mg/dL — ABNORMAL HIGH (ref 70–99)
Potassium: 5.1 mEq/L (ref 3.7–5.3)
SODIUM: 139 meq/L (ref 137–147)
TOTAL PROTEIN: 7 g/dL (ref 6.0–8.3)
Total Bilirubin: 0.3 mg/dL (ref 0.3–1.2)

## 2013-12-15 LAB — CBC
HCT: 38.4 % — ABNORMAL LOW (ref 39.0–52.0)
Hemoglobin: 13.2 g/dL (ref 13.0–17.0)
MCH: 32.1 pg (ref 26.0–34.0)
MCHC: 34.4 g/dL (ref 30.0–36.0)
MCV: 93.4 fL (ref 78.0–100.0)
Platelets: 248 10*3/uL (ref 150–400)
RBC: 4.11 MIL/uL — ABNORMAL LOW (ref 4.22–5.81)
RDW: 14.1 % (ref 11.5–15.5)
WBC: 4.2 10*3/uL (ref 4.0–10.5)

## 2013-12-15 LAB — DIFFERENTIAL
BASOS PCT: 1 % (ref 0–1)
Basophils Absolute: 0 10*3/uL (ref 0.0–0.1)
EOS ABS: 0.1 10*3/uL (ref 0.0–0.7)
Eosinophils Relative: 3 % (ref 0–5)
Lymphocytes Relative: 13 % (ref 12–46)
Lymphs Abs: 0.5 10*3/uL — ABNORMAL LOW (ref 0.7–4.0)
MONO ABS: 0.6 10*3/uL (ref 0.1–1.0)
Monocytes Relative: 14 % — ABNORMAL HIGH (ref 3–12)
NEUTROS ABS: 2.9 10*3/uL (ref 1.7–7.7)
Neutrophils Relative %: 69 % (ref 43–77)

## 2013-12-15 LAB — SEDIMENTATION RATE: Sed Rate: 24 mm/hr — ABNORMAL HIGH (ref 0–16)

## 2013-12-15 LAB — C-REACTIVE PROTEIN: CRP: 1.1 mg/dL — AB (ref <0.60)

## 2013-12-15 MED ORDER — DIPHENHYDRAMINE HCL 50 MG/ML IJ SOLN
25.0000 mg | INTRAMUSCULAR | Status: DC
  Administered 2013-12-15: 25 mg via INTRAVENOUS

## 2013-12-15 MED ORDER — ACETAMINOPHEN 325 MG PO TABS
650.0000 mg | ORAL_TABLET | ORAL | Status: DC
Start: 2013-12-15 — End: 2013-12-16
  Administered 2013-12-15: 650 mg via ORAL

## 2013-12-15 MED ORDER — SODIUM CHLORIDE 0.9 % IV SOLN
5.0000 mg/kg | INTRAVENOUS | Status: DC
  Administered 2013-12-15: 500 mg via INTRAVENOUS
  Filled 2013-12-15: qty 50

## 2013-12-15 MED ORDER — SODIUM CHLORIDE 0.9 % IV SOLN
INTRAVENOUS | Status: DC
  Administered 2013-12-15: 250 mL via INTRAVENOUS

## 2013-12-15 MED ORDER — DIPHENHYDRAMINE HCL 50 MG/ML IJ SOLN
INTRAMUSCULAR | Status: AC
  Administered 2013-12-15: 25 mg via INTRAVENOUS
  Filled 2013-12-15: qty 1

## 2013-12-15 MED ORDER — ACETAMINOPHEN 325 MG PO TABS
ORAL_TABLET | ORAL | Status: AC
  Administered 2013-12-15: 650 mg via ORAL
  Filled 2013-12-15: qty 2

## 2014-06-23 ENCOUNTER — Encounter (HOSPITAL_COMMUNITY): Payer: Self-pay | Admitting: Physical Medicine and Rehabilitation

## 2014-06-23 ENCOUNTER — Emergency Department (HOSPITAL_COMMUNITY)
Admission: EM | Admit: 2014-06-23 | Discharge: 2014-06-23 | Disposition: A | Discharge status: Home / Self Care | Payer: Medicare Other | Attending: Emergency Medicine | Admitting: Emergency Medicine

## 2014-06-23 DIAGNOSIS — M545 Low back pain: Secondary | ICD-10-CM | POA: Diagnosis present

## 2014-06-23 DIAGNOSIS — Z79899 Other long term (current) drug therapy: Secondary | ICD-10-CM | POA: Diagnosis not present

## 2014-06-23 DIAGNOSIS — M5431 Sciatica, right side: Secondary | ICD-10-CM | POA: Diagnosis not present

## 2014-06-23 DIAGNOSIS — Z8719 Personal history of other diseases of the digestive system: Secondary | ICD-10-CM | POA: Diagnosis not present

## 2014-06-23 DIAGNOSIS — Z87891 Personal history of nicotine dependence: Secondary | ICD-10-CM | POA: Insufficient documentation

## 2014-06-23 MED ORDER — DIAZEPAM 5 MG PO TABS
5.0000 mg | ORAL_TABLET | Freq: Once | ORAL | Status: AC
  Administered 2014-06-23: 5 mg via ORAL
  Filled 2014-06-23: qty 1

## 2014-06-23 MED ORDER — DIAZEPAM 5 MG PO TABS: 5.0000 mg | ORAL_TABLET | Freq: Four times a day (QID) | ORAL | Status: DC | PRN

## 2014-06-23 NOTE — ED Provider Notes (Signed)
CSN: [id number]     Arrival date & time 06/23/14  1400 History   First MD Initiated Contact with Patient 06/23/14 1501     Chief Complaint  Patient presents with  . Back Pain     (Consider location/radiation/quality/duration/timing/severity/associated sxs/prior Treatment) Patient is a 79 y.o. male presenting with back pain.  Back Pain Location:  Lumbar spine Quality:  Aching and stabbing Radiates to:  Does not radiate Pain severity:  Mild Onset quality:  Gradual Duration:  5 days Timing:  Constant Progression:  Unchanged Chronicity:  New Context: not physical stress and not recent injury   Relieved by:  Nothing Exacerbated by: standing up. Associated symptoms: no abdominal pain, no bladder incontinence, no bowel incontinence, no leg pain, no numbness, no paresthesias, no perianal numbness, no tingling and no weakness     Past Medical History  Diagnosis Date  . Stomach ulcer   . Colitis    No past surgical history on file. History reviewed. No pertinent family history. History  Substance Use Topics  . Smoking status: Former Research scientist (life sciences)  . Smokeless tobacco: Not on file  . Alcohol Use: 1.2 oz/week    2 Shots of liquor per week    Review of Systems  Gastrointestinal: Negative for abdominal pain and bowel incontinence.  Genitourinary: Negative for bladder incontinence.  Musculoskeletal: Positive for back pain.  Neurological: Negative for tingling, weakness, numbness and paresthesias.  All other systems reviewed and are negative.     Allergies  Keflex  Home Medications   Prior to Admission medications   Medication Sig Start Date End Date Taking? Authorizing Provider  Cholecalciferol (VITAMIN D3) 2000 UNITS capsule Take 2,000 Units by mouth daily.   Yes Historical Provider, MD  enalapril (VASOTEC) 5 MG tablet Take 5 mg by mouth 2 (two) times daily.   Yes Historical Provider, MD  fenofibrate (TRICOR) 145 MG tablet Take 145 mg by mouth daily.   Yes Historical Provider,  MD  glimepiride (AMARYL) 1 MG tablet Take 1 mg by mouth daily with supper.   Yes Historical Provider, MD  halobetasol (ULTRAVATE) 0.05 % cream Apply 1 application topically 2 (two) times daily as needed (itching).   Yes Historical Provider, MD  levothyroxine (LEVOXYL) 75 MCG tablet Take 75 mcg by mouth daily before breakfast.   Yes Historical Provider, MD  metFORMIN (GLUCOPHAGE) 850 MG tablet Take 850 mg by mouth 2 (two) times daily with a meal.   Yes Historical Provider, MD  pioglitazone (ACTOS) 15 MG tablet Take 15 mg by mouth every morning.   Yes Historical Provider, MD  simvastatin (ZOCOR) 20 MG tablet Take 20 mg by mouth every evening.   Yes Historical Provider, MD  tamsulosin (FLOMAX) 0.4 MG CAPS capsule Take 0.4 mg by mouth daily.   Yes Historical Provider, MD  Vedolizumab (ENTYVIO IV) Inject into the vein. Every 8 weeks, next dose due on the 27th. Patient is not sure what dose the medication is.   Yes Historical Provider, MD  chlorpheniramine-HYDROcodone (TUSSIONEX PENNKINETIC ER) 10-8 MG/5ML LQCR Take 5 mLs by mouth every 12 (twelve) hours as needed for cough. Patient not taking: Reported on 06/23/2014 12/31/12   Ignacia Felling, PA-C  diazepam (VALIUM) 5 MG tablet Take 1 tablet (5 mg total) by mouth every 6 (six) hours as needed for muscle spasms. 06/23/14   Debby Freiberg, MD  fexofenadine-pseudoephedrine (ALLEGRA-D) 60-120 MG per tablet Take 1 tablet by mouth 2 (two) times daily. Patient not taking: Reported on 06/23/2014 01/05/13   Virgel Manifold,  MD  loratadine (CLARITIN) 10 MG tablet Take 1 tablet (10 mg total) by mouth daily as needed for allergies. Patient not taking: Reported on 06/23/2014 01/05/13   Virgel Manifold, MD  predniSONE (DELTASONE) 20 MG tablet Take 2 tablets (40 mg total) by mouth daily. Patient not taking: Reported on 06/23/2014 01/05/13   Virgel Manifold, MD   BP 134/83 mmHg  Pulse 45  Temp(Src) 97.8 F (36.6 C) (Oral)  Resp 16  Ht 5' 7"  (1.702 m)  Wt 205 lb (92.987  kg)  BMI 32.10 kg/m2  SpO2 100% Physical Exam  Constitutional: He is oriented to person, place, and time. He appears well-developed and well-nourished.  HENT:  Head: Normocephalic and atraumatic.  Eyes: Conjunctivae and EOM are normal.  Neck: Normal range of motion. Neck supple.  Cardiovascular: Normal rate, regular rhythm and normal heart sounds.   Pulmonary/Chest: Effort normal and breath sounds normal. No respiratory distress.  Abdominal: He exhibits no distension. There is no tenderness. There is no rebound and no guarding.  Musculoskeletal: Normal range of motion.  Neurological: He is alert and oriented to person, place, and time. He has normal strength and normal reflexes. No cranial nerve deficit or sensory deficit. He displays a negative Romberg sign.  Skin: Skin is warm and dry.  Vitals reviewed.   ED Course  Procedures (including critical care time) Labs Review Labs Reviewed - No data to display  Imaging Review No results found.   EKG Interpretation None      MDM   Final diagnoses:  Sciatica, right    79 y.o. male with pertinent PMH of PUD presents with atraumatic back pain as above. Pain radiates intermittently into R hip.  Patient denies recent stressors, no systemic symptoms. No historical elements of cauda equina. On arrival vital signs and physical exam as above. No neurologic deficits. Patient has only minimal pain, my exam. Discussed likely radicular source of pain. Patient lives in [place] and will follow-up with his PCP for surgery follow-up. DC home in stable condition with small amount of Valium..    I have reviewed all laboratory and imaging studies if ordered as above  1. Sciatica, right         Debby Freiberg, MD 06/23/14 [phone]

## 2014-06-23 NOTE — Discharge Instructions (Signed)
Back Pain, Adult Low back pain is very common. About 1 in 5 people have back pain.The cause of low back pain is rarely dangerous. The pain often gets better over time.About half of people with a sudden onset of back pain feel better in just 2 weeks. About 8 in 10 people feel better by 6 weeks.  CAUSES Some common causes of back pain include:  Strain of the muscles or ligaments supporting the spine.  Wear and tear (degeneration) of the spinal discs.  Arthritis.  Direct injury to the back. DIAGNOSIS Most of the time, the direct cause of low back pain is not known.However, back pain can be treated effectively even when the exact cause of the pain is unknown.Answering your caregiver's questions about your overall health and symptoms is one of the most accurate ways to make sure the cause of your pain is not dangerous. If your caregiver needs more information, he or she may order lab work or imaging tests (X-rays or MRIs).However, even if imaging tests show changes in your back, this usually does not require surgery. HOME CARE INSTRUCTIONS For many people, back pain returns.Since low back pain is rarely dangerous, it is often a condition that people can learn to manageon their own.   Remain active. It is stressful on the back to sit or stand in one place. Do not sit, drive, or stand in one place for more than 30 minutes at a time. Take short walks on level surfaces as soon as pain allows.Try to increase the length of time you walk each day.  Do not stay in bed.Resting more than 1 or 2 days can delay your recovery.  Do not avoid exercise or work.Your body is made to move.It is not dangerous to be active, even though your back may hurt.Your back will likely heal faster if you return to being active before your pain is gone.  Pay attention to your body when you bend and lift. Many people have less discomfortwhen lifting if they bend their knees, keep the load close to their bodies,and  avoid twisting. Often, the most comfortable positions are those that put less stress on your recovering back.  Find a comfortable position to sleep. Use a firm mattress and lie on your side with your knees slightly bent. If you lie on your back, put a pillow under your knees.  Only take over-the-counter or prescription medicines as directed by your caregiver. Over-the-counter medicines to reduce pain and inflammation are often the most helpful.Your caregiver may prescribe muscle relaxant drugs.These medicines help dull your pain so you can more quickly return to your normal activities and healthy exercise.  Put ice on the injured area.  Put ice in a plastic bag.  Place a towel between your skin and the bag.  Leave the ice on for 15-20 minutes, 03-04 times a day for the first 2 to 3 days. After that, ice and heat may be alternated to reduce pain and spasms.  Ask your caregiver about trying back exercises and gentle massage. This may be of some benefit.  Avoid feeling anxious or stressed.Stress increases muscle tension and can worsen back pain.It is important to recognize when you are anxious or stressed and learn ways to manage it.Exercise is a great option. SEEK MEDICAL CARE IF:  You have pain that is not relieved with rest or medicine.  You have pain that does not improve in 1 week.  You have new symptoms.  You are generally not feeling well. SEEK   IMMEDIATE MEDICAL CARE IF:   You have pain that radiates from your back into your legs.  You develop new bowel or bladder control problems.  You have unusual weakness or numbness in your arms or legs.  You develop nausea or vomiting.  You develop abdominal pain.  You feel faint. Document Released: 12/24/2004 Document Revised: 06/25/2011 Document Reviewed: 04/27/2013 Exit[employer] Patient Information 2015 [employer]. This information is not intended to replace advice given to you by your health care provider. Make sure you  discuss any questions you have with your health care provider.  

## 2014-06-23 NOTE — ED Notes (Signed)
Pt presents to department for evaluation of lower back pain. 9/10 pain, increases with walking and movement. Pt ambulatory to triage. Denies recent injury.

## 2014-06-25 ENCOUNTER — Emergency Department (HOSPITAL_COMMUNITY)
Admission: EM | Admit: 2014-06-25 | Discharge: 2014-06-25 | Disposition: A | Discharge status: Home / Self Care | Payer: Medicare Other | Attending: Emergency Medicine | Admitting: Emergency Medicine

## 2014-06-25 ENCOUNTER — Emergency Department (HOSPITAL_COMMUNITY): Payer: Medicare Other

## 2014-06-25 ENCOUNTER — Encounter (HOSPITAL_COMMUNITY): Payer: Self-pay | Admitting: Emergency Medicine

## 2014-06-25 DIAGNOSIS — Z79899 Other long term (current) drug therapy: Secondary | ICD-10-CM | POA: Insufficient documentation

## 2014-06-25 DIAGNOSIS — M545 Low back pain: Secondary | ICD-10-CM | POA: Insufficient documentation

## 2014-06-25 DIAGNOSIS — M549 Dorsalgia, unspecified: Secondary | ICD-10-CM

## 2014-06-25 DIAGNOSIS — Z87891 Personal history of nicotine dependence: Secondary | ICD-10-CM | POA: Diagnosis not present

## 2014-06-25 DIAGNOSIS — Z8719 Personal history of other diseases of the digestive system: Secondary | ICD-10-CM | POA: Insufficient documentation

## 2014-06-25 MED ORDER — NAPROXEN 500 MG PO TABS: 500.0000 mg | ORAL_TABLET | Freq: Two times a day (BID) | ORAL | Status: DC

## 2014-06-25 MED ORDER — OXYCODONE-ACETAMINOPHEN 5-325 MG PO TABS: 2.0000 | ORAL_TABLET | ORAL | Status: DC | PRN

## 2014-06-25 MED ORDER — METHYLPREDNISOLONE 4 MG PO TBPK: ORAL_TABLET | ORAL | Status: DC

## 2014-06-25 MED ORDER — METHOCARBAMOL 500 MG PO TABS: 500.0000 mg | ORAL_TABLET | Freq: Two times a day (BID) | ORAL | Status: DC

## 2014-06-25 NOTE — ED Notes (Signed)
MRI called to have a disc made for pt with MRI results

## 2014-06-25 NOTE — ED Notes (Signed)
Pt reports back pain onset couple weeks where he noticed it after bending over tying his shoes then trying to sit up. Pain increased Tuesday/Wednesday. Pt seen here Thursday and given valium without relief. Pt also has been taking aleve. Pt requesting a xray.

## 2014-06-25 NOTE — ED Notes (Signed)
Patient requested to sit in chair instead of bed due to back pain.

## 2014-06-25 NOTE — Discharge Instructions (Signed)
Your eyes shows some degenerative changes. Some of the nerve foramen have degeneration around them which can cause pain. If this does not improve with oral medications, you may need to be seen by a neurosurgeon to arrange for local steroidal injections.

## 2014-07-04 ENCOUNTER — Inpatient Hospital Stay (HOSPITAL_COMMUNITY): Admission: RE | Admit: 2014-07-04 | Payer: Medicare Other | Source: Ambulatory Visit

## 2014-07-05 NOTE — ED Provider Notes (Signed)
CSN: [id number]     Arrival date & time 06/25/14  0857 History   First MD Initiated Contact with Patient 06/25/14 [phone]     Chief Complaint  Patient presents with  . Back Pain      HPI  History presented for evaluation of back pain. States 2 weeks ago he was bent over tying his shoes and went to sit up has sudden back pain. Is radiating to his bilateral upper buttocks. Not to his legs. No numbness weakness or tingling to his buttocks when he states he's been incapacitated with pain. Given Valium upon evaluation ER 2 days ago states he is not improved presents for evaluation. Review of back pain. No history of cancer. No fevers chills. No bowel or bladder changes.   Past Medical History  Diagnosis Date  . Stomach ulcer   . Colitis    History reviewed. No pertinent past surgical history. No family history on file. History  Substance Use Topics  . Smoking status: Former Research scientist (life sciences)  . Smokeless tobacco: Not on file  . Alcohol Use: 1.2 oz/week    2 Shots of liquor per week    Review of Systems  Constitutional: Negative for fever, chills, diaphoresis, appetite change and fatigue.  HENT: Negative for mouth sores, sore throat and trouble swallowing.   Eyes: Negative for visual disturbance.  Respiratory: Negative for cough, chest tightness, shortness of breath and wheezing.   Cardiovascular: Negative for chest pain.  Gastrointestinal: Negative for nausea, vomiting, abdominal pain, diarrhea and abdominal distention.  Endocrine: Negative for polydipsia, polyphagia and polyuria.  Genitourinary: Negative for dysuria, frequency and hematuria.  Musculoskeletal: Positive for back pain. Negative for gait problem.  Skin: Negative for color change, pallor and rash.  Neurological: Negative for dizziness, syncope, light-headedness and headaches.  Hematological: Does not bruise/bleed easily.  Psychiatric/Behavioral: Negative for behavioral problems and confusion.      Allergies  Keflex  Home  Medications   Prior to Admission medications   Medication Sig Start Date End Date Taking? Authorizing Provider  Cholecalciferol (VITAMIN D3) 2000 UNITS capsule Take 2,000 Units by mouth daily.   Yes Historical Provider, MD  diazepam (VALIUM) 5 MG tablet Take 1 tablet (5 mg total) by mouth every 6 (six) hours as needed for muscle spasms. 06/23/14  Yes Debby Freiberg, MD  enalapril (VASOTEC) 5 MG tablet Take 5 mg by mouth 2 (two) times daily.   Yes Historical Provider, MD  fenofibrate (TRICOR) 145 MG tablet Take 145 mg by mouth daily.   Yes Historical Provider, MD  glimepiride (AMARYL) 1 MG tablet Take 1 mg by mouth daily with supper.   Yes Historical Provider, MD  halobetasol (ULTRAVATE) 0.05 % cream Apply 1 application topically 2 (two) times daily as needed (itching).   Yes Historical Provider, MD  levothyroxine (LEVOXYL) 75 MCG tablet Take 75 mcg by mouth daily before breakfast.   Yes Historical Provider, MD  metFORMIN (GLUCOPHAGE) 850 MG tablet Take 850 mg by mouth 2 (two) times daily with a meal.   Yes Historical Provider, MD  pioglitazone (ACTOS) 15 MG tablet Take 15 mg by mouth every morning.   Yes Historical Provider, MD  simvastatin (ZOCOR) 20 MG tablet Take 20 mg by mouth every evening.   Yes Historical Provider, MD  tamsulosin (FLOMAX) 0.4 MG CAPS capsule Take 0.4 mg by mouth daily.   Yes Historical Provider, MD  Vedolizumab (ENTYVIO IV) Inject into the vein. Every 8 weeks, next dose due on the 27th. Patient is not sure  what dose the medication is.   Yes Historical Provider, MD  loratadine (CLARITIN) 10 MG tablet Take 1 tablet (10 mg total) by mouth daily as needed for allergies. Patient not taking: Reported on 06/23/2014 01/05/13   Virgel Manifold, MD  methocarbamol (ROBAXIN) 500 MG tablet Take 1 tablet (500 mg total) by mouth 2 (two) times daily. 06/25/14   Tanna Furry, MD  methylPREDNISolone (MEDROL DOSEPAK) 4 MG TBPK tablet 6 po on day 1, decrease by 1 tab per day 06/25/14   Tanna Furry, MD    naproxen (NAPROSYN) 500 MG tablet Take 1 tablet (500 mg total) by mouth 2 (two) times daily. 06/25/14   Tanna Furry, MD  oxyCODONE-acetaminophen (PERCOCET/ROXICET) 5-325 MG per tablet Take 2 tablets by mouth every 4 (four) hours as needed. 06/25/14   Tanna Furry, MD  predniSONE (DELTASONE) 20 MG tablet Take 2 tablets (40 mg total) by mouth daily. Patient not taking: Reported on 06/23/2014 01/05/13   Virgel Manifold, MD   BP 142/70 mmHg  Pulse 66  Temp(Src) 97.5 F (36.4 C) (Oral)  Resp 17  Ht 5' 7"  (1.702 m)  Wt 205 lb (92.987 kg)  BMI 32.10 kg/m2  SpO2 98% Physical Exam  Constitutional: He is oriented to person, place, and time. He appears well-developed and well-nourished. No distress.  HENT:  Head: Normocephalic.  Eyes: Conjunctivae are normal. Pupils are equal, round, and reactive to light. No scleral icterus.  Neck: Normal range of motion. Neck supple. No thyromegaly present.  Cardiovascular: Normal rate and regular rhythm.  Exam reveals no gallop and no friction rub.   No murmur heard. Pulmonary/Chest: Effort normal and breath sounds normal. No respiratory distress. He has no wheezes. He has no rales.  Abdominal: Soft. Bowel sounds are normal. He exhibits no distension. There is no tenderness. There is no rebound.  Musculoskeletal: Normal range of motion.       Back:  Neurological: He is alert and oriented to person, place, and time.  Social with moderate to severe back pain upon movement of his extremities. However he has intact reflexes and strength to lower extremities.  Skin: Skin is warm and dry. No rash noted.  Psychiatric: He has a normal mood and affect. His behavior is normal.    ED Course  Procedures (including critical care time) Labs Review Labs Reviewed - No data to display  Imaging Review No results found.   EKG Interpretation None      MDM   Final diagnoses:  Back pain    MRI shows degenerative changes but no acute herniation. Plan will be  conservative treatment pain medicines, Rolaids, and inflammatory, primary care follow-up.   Tanna Furry, MD 07/05/14 [phone]

## 2014-10-18 DIAGNOSIS — K5190 Ulcerative colitis, unspecified, without complications: Secondary | ICD-10-CM | POA: Insufficient documentation

## 2015-04-28 ENCOUNTER — Encounter (HOSPITAL_COMMUNITY): Payer: Self-pay | Admitting: *Deleted

## 2015-04-28 ENCOUNTER — Emergency Department (HOSPITAL_COMMUNITY)
Admission: EM | Admit: 2015-04-28 | Discharge: 2015-04-28 | Disposition: A | Discharge status: Home / Self Care | Payer: Medicare Other | Attending: Emergency Medicine | Admitting: Emergency Medicine

## 2015-04-28 ENCOUNTER — Emergency Department (HOSPITAL_COMMUNITY): Payer: Medicare Other

## 2015-04-28 DIAGNOSIS — J0110 Acute frontal sinusitis, unspecified: Secondary | ICD-10-CM | POA: Insufficient documentation

## 2015-04-28 DIAGNOSIS — Z7984 Long term (current) use of oral hypoglycemic drugs: Secondary | ICD-10-CM | POA: Insufficient documentation

## 2015-04-28 DIAGNOSIS — Z79899 Other long term (current) drug therapy: Secondary | ICD-10-CM | POA: Diagnosis not present

## 2015-04-28 DIAGNOSIS — Z87891 Personal history of nicotine dependence: Secondary | ICD-10-CM | POA: Insufficient documentation

## 2015-04-28 DIAGNOSIS — R0981 Nasal congestion: Secondary | ICD-10-CM | POA: Diagnosis present

## 2015-04-28 DIAGNOSIS — Z8719 Personal history of other diseases of the digestive system: Secondary | ICD-10-CM | POA: Insufficient documentation

## 2015-04-28 DIAGNOSIS — Z791 Long term (current) use of non-steroidal anti-inflammatories (NSAID): Secondary | ICD-10-CM | POA: Diagnosis not present

## 2015-04-28 MED ORDER — PREDNISONE 20 MG PO TABS
40.0000 mg | ORAL_TABLET | Freq: Once | ORAL | Status: AC
  Administered 2015-04-28: 40 mg via ORAL
  Filled 2015-04-28: qty 2

## 2015-04-28 MED ORDER — BENZONATATE 100 MG PO CAPS
100.0000 mg | ORAL_CAPSULE | Freq: Once | ORAL | Status: AC
  Administered 2015-04-28: 100 mg via ORAL
  Filled 2015-04-28: qty 1

## 2015-04-28 MED ORDER — PREDNISONE 20 MG PO TABS
ORAL_TABLET | ORAL | Status: DC
Start: 2015-04-28 — End: 2017-07-18

## 2015-04-28 MED ORDER — BENZONATATE 100 MG PO CAPS: 100.0000 mg | ORAL_CAPSULE | Freq: Three times a day (TID) | ORAL | Status: DC

## 2015-04-28 MED ORDER — AMOXICILLIN-POT CLAVULANATE 875-125 MG PO TABS: 1.0000 | ORAL_TABLET | Freq: Two times a day (BID) | ORAL | Status: DC

## 2015-04-28 NOTE — Discharge Instructions (Signed)
Take tylenol 2 pills 4 times a day. Drink plenty of fluids.  Return for worsening shortness of breath, headache, confusion. Follow up with your family doctor.   Sinusitis, Adult Sinusitis is redness, soreness, and inflammation of the paranasal sinuses. Paranasal sinuses are air pockets within the bones of your face. They are located beneath your eyes, in the middle of your forehead, and above your eyes. In healthy paranasal sinuses, mucus is able to drain out, and air is able to circulate through them by way of your nose. However, when your paranasal sinuses are inflamed, mucus and air can become trapped. This can allow bacteria and other germs to grow and cause infection. Sinusitis can develop quickly and last only a short time (acute) or continue over a long period (chronic). Sinusitis that lasts for more than 12 weeks is considered chronic. CAUSES Causes of sinusitis include:  Allergies.  Structural abnormalities, such as displacement of the cartilage that separates your nostrils (deviated septum), which can decrease the air flow through your nose and sinuses and affect sinus drainage.  Functional abnormalities, such as when the small hairs (cilia) that line your sinuses and help remove mucus do not work properly or are not present. SIGNS AND SYMPTOMS Symptoms of acute and chronic sinusitis are the same. The primary symptoms are pain and pressure around the affected sinuses. Other symptoms include:  Upper toothache.  Earache.  Headache.  Bad breath.  Decreased sense of smell and taste.  A cough, which worsens when you are lying flat.  Fatigue.  Fever.  Thick drainage from your nose, which often is green and may contain pus (purulent).  Swelling and warmth over the affected sinuses. DIAGNOSIS Your health care provider will perform a physical exam. During your exam, your health care provider may perform any of the following to help determine if you have acute sinusitis or chronic  sinusitis:  Look in your nose for signs of abnormal growths in your nostrils (nasal polyps).  Tap over the affected sinus to check for signs of infection.  View the inside of your sinuses using an imaging device that has a light attached (endoscope). If your health care provider suspects that you have chronic sinusitis, one or more of the following tests may be recommended:  Allergy tests.  Nasal culture. A sample of mucus is taken from your nose, sent to a lab, and screened for bacteria.  Nasal cytology. A sample of mucus is taken from your nose and examined by your health care provider to determine if your sinusitis is related to an allergy. TREATMENT Most cases of acute sinusitis are related to a viral infection and will resolve on their own within 10 days. Sometimes, medicines are prescribed to help relieve symptoms of both acute and chronic sinusitis. These may include pain medicines, decongestants, nasal steroid sprays, or saline sprays. However, for sinusitis related to a bacterial infection, your health care provider will prescribe antibiotic medicines. These are medicines that will help kill the bacteria causing the infection. Rarely, sinusitis is caused by a fungal infection. In these cases, your health care provider will prescribe antifungal medicine. For some cases of chronic sinusitis, surgery is needed. Generally, these are cases in which sinusitis recurs more than 3 times per year, despite other treatments. HOME CARE INSTRUCTIONS  Drink plenty of water. Water helps thin the mucus so your sinuses can drain more easily.  Use a humidifier.  Inhale steam 3-4 times a day (for example, sit in the bathroom with the shower  running).  Apply a warm, moist washcloth to your face 3-4 times a day, or as directed by your health care provider.  Use saline nasal sprays to help moisten and clean your sinuses.  Take medicines only as directed by your health care provider.  If you were  prescribed either an antibiotic or antifungal medicine, finish it all even if you start to feel better. SEEK IMMEDIATE MEDICAL CARE IF:  You have increasing pain or severe headaches.  You have nausea, vomiting, or drowsiness.  You have swelling around your face.  You have vision problems.  You have a stiff neck.  You have difficulty breathing.   This information is not intended to replace advice given to you by your health care provider. Make sure you discuss any questions you have with your health care provider.   Document Released: 12/24/2004 Document Revised: 01/14/2014 Document Reviewed: 01/08/2011 Elsevier Interactive Patient Education [employer].

## 2015-04-28 NOTE — ED Provider Notes (Signed)
CSN: [id number]     Arrival date & time 04/28/15  1057 History   First MD Initiated Contact with Patient 04/28/15 1300     Chief Complaint  Patient presents with  . Nasal Congestion     (Consider location/radiation/quality/duration/timing/severity/associated sxs/prior Treatment) Patient is a 80 y.o. male presenting with general illness. The history is provided by the patient.  Illness Severity:  Moderate Onset quality:  Gradual Duration:  2 days Timing:  Constant Progression:  Worsening Chronicity:  New Associated symptoms: congestion and headaches   Associated symptoms: no abdominal pain, no chest pain, no diarrhea, no fever, no myalgias, no rash, no shortness of breath and no vomiting    80 yo M with a cc of uri.  Going on for a couple days.  Cough, congestion.  Worst at night.  Some right frontal headaches.  Denies sick contacts.  Hx of similar illness in past, he feels really improved with steroids.   Past Medical History  Diagnosis Date  . Stomach ulcer   . Colitis    History reviewed. No pertinent past surgical history. No family history on file. Social History  Substance Use Topics  . Smoking status: Former Research scientist (life sciences)  . Smokeless tobacco: None  . Alcohol Use: 1.2 oz/week    2 Shots of liquor per week    Review of Systems  Constitutional: Negative for fever and chills.  HENT: Positive for congestion. Negative for facial swelling.   Eyes: Negative for discharge and visual disturbance.  Respiratory: Positive for choking. Negative for shortness of breath.   Cardiovascular: Negative for chest pain and palpitations.  Gastrointestinal: Negative for vomiting, abdominal pain and diarrhea.  Musculoskeletal: Negative for myalgias and arthralgias.  Skin: Negative for color change and rash.  Neurological: Positive for headaches. Negative for tremors and syncope.  Psychiatric/Behavioral: Negative for confusion and dysphoric mood.      Allergies  Keflex  Home Medications     Prior to Admission medications   Medication Sig Start Date End Date Taking? Authorizing Provider  amoxicillin-clavulanate (AUGMENTIN) 875-125 MG tablet Take 1 tablet by mouth 2 (two) times daily. 04/28/15   Deno Etienne, DO  benzonatate (TESSALON) 100 MG capsule Take 1 capsule (100 mg total) by mouth every 8 (eight) hours. 04/28/15   Deno Etienne, DO  Cholecalciferol (VITAMIN D3) 2000 UNITS capsule Take 2,000 Units by mouth daily.    Historical Provider, MD  diazepam (VALIUM) 5 MG tablet Take 1 tablet (5 mg total) by mouth every 6 (six) hours as needed for muscle spasms. 06/23/14   Debby Freiberg, MD  enalapril (VASOTEC) 5 MG tablet Take 5 mg by mouth 2 (two) times daily.    Historical Provider, MD  fenofibrate (TRICOR) 145 MG tablet Take 145 mg by mouth daily.    Historical Provider, MD  glimepiride (AMARYL) 1 MG tablet Take 1 mg by mouth daily with supper.    Historical Provider, MD  halobetasol (ULTRAVATE) 0.05 % cream Apply 1 application topically 2 (two) times daily as needed (itching).    Historical Provider, MD  levothyroxine (LEVOXYL) 75 MCG tablet Take 75 mcg by mouth daily before breakfast.    Historical Provider, MD  loratadine (CLARITIN) 10 MG tablet Take 1 tablet (10 mg total) by mouth daily as needed for allergies. Patient not taking: Reported on 06/23/2014 01/05/13   Virgel Manifold, MD  metFORMIN (GLUCOPHAGE) 850 MG tablet Take 850 mg by mouth 2 (two) times daily with a meal.    Historical Provider, MD  methocarbamol (ROBAXIN)  500 MG tablet Take 1 tablet (500 mg total) by mouth 2 (two) times daily. 06/25/14   Tanna Furry, MD  methylPREDNISolone (MEDROL DOSEPAK) 4 MG TBPK tablet 6 po on day 1, decrease by 1 tab per day 06/25/14   Tanna Furry, MD  naproxen (NAPROSYN) 500 MG tablet Take 1 tablet (500 mg total) by mouth 2 (two) times daily. 06/25/14   Tanna Furry, MD  oxyCODONE-acetaminophen (PERCOCET/ROXICET) 5-325 MG per tablet Take 2 tablets by mouth every 4 (four) hours as needed. 06/25/14   Tanna Furry, MD  pioglitazone (ACTOS) 15 MG tablet Take 15 mg by mouth every morning.    Historical Provider, MD  predniSONE (DELTASONE) 20 MG tablet 2 tabs po daily x 4 days 04/28/15   Deno Etienne, DO  simvastatin (ZOCOR) 20 MG tablet Take 20 mg by mouth every evening.    Historical Provider, MD  tamsulosin (FLOMAX) 0.4 MG CAPS capsule Take 0.4 mg by mouth daily.    Historical Provider, MD  Vedolizumab (ENTYVIO IV) Inject into the vein. Every 8 weeks, next dose due on the 27th. Patient is not sure what dose the medication is.    Historical Provider, MD   BP 132/64 mmHg  Pulse 84  Temp(Src) 98.1 F (36.7 C) (Oral)  Resp 16  Ht 5' 7"  (1.702 m)  Wt 202 lb 8 oz (91.853 kg)  BMI 31.71 kg/m2  SpO2 99% Physical Exam  Constitutional: He is oriented to person, place, and time. He appears well-developed and well-nourished.  HENT:  Head: Normocephalic and atraumatic.    Swollen turbinates, posterior nasal drip, R frontal sinus ttp, tm normal bilaterally.    Eyes: EOM are normal. Pupils are equal, round, and reactive to light.  Neck: Normal range of motion. Neck supple. No JVD present.  Cardiovascular: Normal rate and regular rhythm.  Exam reveals no gallop and no friction rub.   No murmur heard. Pulmonary/Chest: No respiratory distress. He has no wheezes.  Abdominal: He exhibits no distension. There is no rebound and no guarding.  Musculoskeletal: Normal range of motion.  Neurological: He is alert and oriented to person, place, and time.  Skin: No rash noted. No pallor.  Psychiatric: He has a normal mood and affect. His behavior is normal.  Nursing note and vitals reviewed.   ED Course  Procedures (including critical care time) Labs Review Labs Reviewed - No data to display  Imaging Review Dg Chest 2 View  04/28/2015  CLINICAL DATA:  Sinus congestion for several days, cough for 4 days, htn, dm EXAM: CHEST  2 VIEW COMPARISON:  07/29/2012 FINDINGS: Heart size normal. Lungs are free of focal  consolidations and pleural effusions. No pulmonary edema. There is an interval remote fracture of the right posterior fifth rib. Degenerative changes are seen in the upper and mid thoracic spine. IMPRESSION: No evidence for acute cardiopulmonary abnormality. Electronically Signed   By: Nolon Nations M.D.   On: 04/28/2015 14:42   I have personally reviewed and evaluated these images and lab results as part of my medical decision-making.   EKG Interpretation None      MDM   Final diagnoses:  Acute frontal sinusitis, recurrence not specified    80 yo M with URI.  TTP about the R frontal sinus will treat for sinusitis.  D/c home.   3:19 PM:  I have discussed the diagnosis/risks/treatment options with the patient and believe the pt to be eligible for discharge home to follow-up with PCP. We also discussed returning  to the ED immediately if new or worsening sx occur. We discussed the sx which are most concerning (e.g., sudden worsening pain, fever, inability to tolerate by mouth) that necessitate immediate return. Medications administered to the patient during their visit and any new prescriptions provided to the patient are listed below.  Medications given during this visit Medications  benzonatate (TESSALON) capsule 100 mg (100 mg Oral Given 04/28/15 1412)  predniSONE (DELTASONE) tablet 40 mg (40 mg Oral Given 04/28/15 1412)    Discharge Medication List as of 04/28/2015  2:50 PM    START taking these medications   Details  amoxicillin-clavulanate (AUGMENTIN) 875-125 MG tablet Take 1 tablet by mouth 2 (two) times daily., Starting 04/28/2015, Until Discontinued, Print    benzonatate (TESSALON) 100 MG capsule Take 1 capsule (100 mg total) by mouth every 8 (eight) hours., Starting 04/28/2015, Until Discontinued, Print        The patient appears reasonably screen and/or stabilized for discharge and I doubt any other medical condition or other [facility] requiring further screening, evaluation, or  treatment in the ED at this time prior to discharge.      Deno Etienne, DO 04/28/15 1519

## 2015-04-28 NOTE — ED Notes (Signed)
Pt states sinus congestion for several days.

## 2015-06-02 ENCOUNTER — Other Ambulatory Visit (HOSPITAL_COMMUNITY): Payer: Self-pay | Admitting: *Deleted

## 2015-06-06 ENCOUNTER — Ambulatory Visit (HOSPITAL_COMMUNITY)
Admission: RE | Admit: 2015-06-06 | Discharge: 2015-06-06 | Disposition: A | Discharge status: Home / Self Care | Payer: Medicare Other | Source: Ambulatory Visit | Attending: Gastroenterology | Admitting: Gastroenterology

## 2015-06-06 DIAGNOSIS — K5190 Ulcerative colitis, unspecified, without complications: Secondary | ICD-10-CM | POA: Diagnosis present

## 2015-06-06 LAB — COMPREHENSIVE METABOLIC PANEL
ALK PHOS: 73 U/L (ref 38–126)
ALT: 19 U/L (ref 17–63)
AST: 23 U/L (ref 15–41)
Albumin: 3.8 g/dL (ref 3.5–5.0)
Anion gap: 8 (ref 5–15)
BILIRUBIN TOTAL: 0.8 mg/dL (ref 0.3–1.2)
BUN: 18 mg/dL (ref 6–20)
CALCIUM: 9.5 mg/dL (ref 8.9–10.3)
CO2: 21 mmol/L — ABNORMAL LOW (ref 22–32)
Chloride: 110 mmol/L (ref 101–111)
Creatinine, Ser: 1.15 mg/dL (ref 0.61–1.24)
GFR calc Af Amer: >60 mL/min (ref >60)
GFR, EST NON AFRICAN AMERICAN: 60 mL/min — AB (ref >60)
Glucose, Bld: 144 mg/dL — ABNORMAL HIGH (ref 65–99)
Potassium: 4.4 mmol/L (ref 3.5–5.1)
Sodium: 139 mmol/L (ref 135–145)
TOTAL PROTEIN: 6.2 g/dL — AB (ref 6.5–8.1)

## 2015-06-06 LAB — CBC
HEMATOCRIT: 41.3 % (ref 39.0–52.0)
Hemoglobin: 14 g/dL (ref 13.0–17.0)
MCH: 32 pg (ref 26.0–34.0)
MCHC: 33.9 g/dL (ref 30.0–36.0)
MCV: 94.3 fL (ref 78.0–100.0)
Platelets: 197 10*3/uL (ref 150–400)
RBC: 4.38 MIL/uL (ref 4.22–5.81)
RDW: 13.8 % (ref 11.5–15.5)
WBC: 4.3 10*3/uL (ref 4.0–10.5)

## 2015-06-06 LAB — C-REACTIVE PROTEIN: CRP: <0.5 mg/dL (ref <1.0)

## 2015-06-06 LAB — SEDIMENTATION RATE: SED RATE: 6 mm/h (ref 0–16)

## 2015-06-06 MED ORDER — ACETAMINOPHEN 325 MG PO TABS
ORAL_TABLET | ORAL | Status: AC
  Filled 2015-06-06: qty 2

## 2015-06-06 MED ORDER — SODIUM CHLORIDE 0.9 % IV SOLN
INTRAVENOUS | Status: DC
  Administered 2015-06-06: 08:00:00 via INTRAVENOUS

## 2015-06-06 MED ORDER — DIPHENHYDRAMINE HCL 50 MG/ML IJ SOLN
25.0000 mg | INTRAMUSCULAR | Status: DC
  Administered 2015-06-06: 25 mg via INTRAVENOUS

## 2015-06-06 MED ORDER — DIPHENHYDRAMINE HCL 50 MG/ML IJ SOLN
INTRAMUSCULAR | Status: AC
  Filled 2015-06-06: qty 1

## 2015-06-06 MED ORDER — ACETAMINOPHEN 325 MG PO TABS
650.0000 mg | ORAL_TABLET | ORAL | Status: DC
  Administered 2015-06-06: 650 mg via ORAL

## 2015-06-06 MED ORDER — VEDOLIZUMAB 300 MG IV SOLR
300.0000 mg | INTRAVENOUS | Status: DC
  Administered 2015-06-06: 300 mg via INTRAVENOUS
  Filled 2015-06-06: qty 5

## 2016-01-28 DIAGNOSIS — M1712 Unilateral primary osteoarthritis, left knee: Secondary | ICD-10-CM | POA: Insufficient documentation

## 2016-01-28 DIAGNOSIS — E119 Type 2 diabetes mellitus without complications: Secondary | ICD-10-CM | POA: Insufficient documentation

## 2016-01-28 DIAGNOSIS — E039 Hypothyroidism, unspecified: Secondary | ICD-10-CM | POA: Insufficient documentation

## 2016-07-08 ENCOUNTER — Other Ambulatory Visit (HOSPITAL_COMMUNITY): Payer: Self-pay | Admitting: *Deleted

## 2016-07-09 ENCOUNTER — Ambulatory Visit (HOSPITAL_COMMUNITY)
Admission: RE | Admit: 2016-07-09 | Discharge: 2016-07-09 | Disposition: A | Discharge status: Home / Self Care | Payer: Medicare Other | Source: Ambulatory Visit | Attending: Gastroenterology | Admitting: Gastroenterology

## 2016-07-09 DIAGNOSIS — K5180 Other ulcerative colitis without complications: Secondary | ICD-10-CM | POA: Insufficient documentation

## 2016-07-09 LAB — CBC
HCT: 41.3 % (ref 39.0–52.0)
Hemoglobin: 14 g/dL (ref 13.0–17.0)
MCH: 32 pg (ref 26.0–34.0)
MCHC: 33.9 g/dL (ref 30.0–36.0)
MCV: 94.3 fL (ref 78.0–100.0)
Platelets: 246 10*3/uL (ref 150–400)
RBC: 4.38 MIL/uL (ref 4.22–5.81)
RDW: 13.4 % (ref 11.5–15.5)
WBC: 5.3 10*3/uL (ref 4.0–10.5)

## 2016-07-09 LAB — COMPREHENSIVE METABOLIC PANEL
ALBUMIN: 3.6 g/dL (ref 3.5–5.0)
ALT: 14 U/L — AB (ref 17–63)
AST: 21 U/L (ref 15–41)
Alkaline Phosphatase: 84 U/L (ref 38–126)
Anion gap: 10 (ref 5–15)
BUN: 22 mg/dL — ABNORMAL HIGH (ref 6–20)
CO2: 19 mmol/L — AB (ref 22–32)
Calcium: 9.3 mg/dL (ref 8.9–10.3)
Chloride: 109 mmol/L (ref 101–111)
Creatinine, Ser: 1.15 mg/dL (ref 0.61–1.24)
GFR calc Af Amer: >60 mL/min (ref >60)
GFR calc non Af Amer: 59 mL/min — ABNORMAL LOW (ref >60)
Glucose, Bld: 106 mg/dL — ABNORMAL HIGH (ref 65–99)
POTASSIUM: 4.2 mmol/L (ref 3.5–5.1)
Sodium: 138 mmol/L (ref 135–145)
Total Bilirubin: 0.7 mg/dL (ref 0.3–1.2)
Total Protein: 6.2 g/dL — ABNORMAL LOW (ref 6.5–8.1)

## 2016-07-09 LAB — SEDIMENTATION RATE: Sed Rate: 19 mm/hr — ABNORMAL HIGH (ref 0–16)

## 2016-07-09 LAB — C-REACTIVE PROTEIN: CRP: 1.4 mg/dL — AB (ref <1.0)

## 2016-07-09 MED ORDER — ACETAMINOPHEN 325 MG PO TABS
650.0000 mg | ORAL_TABLET | Freq: Once | ORAL | Status: AC
  Administered 2016-07-09: 650 mg via ORAL

## 2016-07-09 MED ORDER — SODIUM CHLORIDE 0.9 % IV SOLN
INTRAVENOUS | Status: DC
  Administered 2016-07-09: 12:00:00 via INTRAVENOUS

## 2016-07-09 MED ORDER — DIPHENHYDRAMINE HCL 50 MG/ML IJ SOLN: 25.0000 mg | Freq: Once | INTRAMUSCULAR | Status: DC

## 2016-07-09 MED ORDER — ACETAMINOPHEN 325 MG PO TABS
ORAL_TABLET | ORAL | Status: AC
  Administered 2016-07-09: 650 mg via ORAL
  Filled 2016-07-09: qty 2

## 2016-07-09 MED ORDER — VEDOLIZUMAB 300 MG IV SOLR
300.0000 mg | INTRAVENOUS | Status: DC
  Administered 2016-07-09: 300 mg via INTRAVENOUS
  Filled 2016-07-09: qty 5

## 2016-08-21 ENCOUNTER — Emergency Department (HOSPITAL_COMMUNITY): Payer: Medicare Other

## 2016-08-21 ENCOUNTER — Encounter (HOSPITAL_COMMUNITY): Payer: Self-pay

## 2016-08-21 ENCOUNTER — Emergency Department (HOSPITAL_COMMUNITY)
Admission: EM | Admit: 2016-08-21 | Discharge: 2016-08-21 | Disposition: A | Discharge status: Home / Self Care | Payer: Medicare Other | Attending: Emergency Medicine | Admitting: Emergency Medicine

## 2016-08-21 DIAGNOSIS — Y998 Other external cause status: Secondary | ICD-10-CM | POA: Insufficient documentation

## 2016-08-21 DIAGNOSIS — Y929 Unspecified place or not applicable: Secondary | ICD-10-CM | POA: Diagnosis not present

## 2016-08-21 DIAGNOSIS — Z7984 Long term (current) use of oral hypoglycemic drugs: Secondary | ICD-10-CM | POA: Diagnosis not present

## 2016-08-21 DIAGNOSIS — S299XXA Unspecified injury of thorax, initial encounter: Secondary | ICD-10-CM | POA: Insufficient documentation

## 2016-08-21 DIAGNOSIS — Y939 Activity, unspecified: Secondary | ICD-10-CM | POA: Diagnosis not present

## 2016-08-21 DIAGNOSIS — R40241 Glasgow coma scale score 13-15, unspecified time: Secondary | ICD-10-CM | POA: Insufficient documentation

## 2016-08-21 DIAGNOSIS — I10 Essential (primary) hypertension: Secondary | ICD-10-CM | POA: Insufficient documentation

## 2016-08-21 DIAGNOSIS — W19XXXA Unspecified fall, initial encounter: Secondary | ICD-10-CM | POA: Diagnosis not present

## 2016-08-21 DIAGNOSIS — E119 Type 2 diabetes mellitus without complications: Secondary | ICD-10-CM | POA: Diagnosis not present

## 2016-08-21 DIAGNOSIS — Z79899 Other long term (current) drug therapy: Secondary | ICD-10-CM | POA: Insufficient documentation

## 2016-08-21 DIAGNOSIS — T148XXA Other injury of unspecified body region, initial encounter: Secondary | ICD-10-CM

## 2016-08-21 HISTORY — DX: Type 2 diabetes mellitus without complications: E11.9

## 2016-08-21 HISTORY — DX: Essential (primary) hypertension: I10

## 2016-08-21 LAB — CBC
HEMATOCRIT: 40.6 % (ref 39.0–52.0)
HEMOGLOBIN: 14.2 g/dL (ref 13.0–17.0)
MCH: 32.9 pg (ref 26.0–34.0)
MCHC: 35 g/dL (ref 30.0–36.0)
MCV: 94 fL (ref 78.0–100.0)
Platelets: 220 10*3/uL (ref 150–400)
RBC: 4.32 MIL/uL (ref 4.22–5.81)
RDW: 13.4 % (ref 11.5–15.5)
WBC: 7.6 10*3/uL (ref 4.0–10.5)

## 2016-08-21 LAB — BASIC METABOLIC PANEL
ANION GAP: 10 (ref 5–15)
BUN: 24 mg/dL — ABNORMAL HIGH (ref 6–20)
CHLORIDE: 110 mmol/L (ref 101–111)
CO2: 18 mmol/L — AB (ref 22–32)
Calcium: 9.2 mg/dL (ref 8.9–10.3)
Creatinine, Ser: 1.25 mg/dL — ABNORMAL HIGH (ref 0.61–1.24)
GFR calc non Af Amer: 53 mL/min — ABNORMAL LOW (ref >60)
GLUCOSE: 115 mg/dL — AB (ref 65–99)
Potassium: 4.4 mmol/L (ref 3.5–5.1)
Sodium: 138 mmol/L (ref 135–145)

## 2016-08-21 LAB — I-STAT TROPONIN, ED: Troponin i, poc: 0 ng/mL (ref 0.00–0.08)

## 2016-08-21 MED ORDER — METHOCARBAMOL 500 MG PO TABS: 500.0000 mg | ORAL_TABLET | Freq: Two times a day (BID) | ORAL | 0 refills | Status: DC

## 2016-08-21 NOTE — ED Provider Notes (Signed)
[facility] DEPT Provider Note   CSN: [id number] Arrival date & time: 08/21/16  1334     History   Chief Complaint Chief Complaint  Patient presents with  . Chest Pain  . Fall    HPI Patrick Carson is a 81 y.o. male.  81 year old male presents with right-sided chest pain that began after he fell. Denies any head or neck trauma from this. Pain is sharp and localized to his right lateral chest and worse with movement. Pain does radiate to his upper back. Denies any dyspnea. No hemoptysis. No abdominal discomfort. Has used Aleve with some relief.      Past Medical History:  Diagnosis Date  . Colitis   . Diabetes mellitus without complication ([place])   . Hypertension   . Stomach ulcer     There are no active problems to display for this patient.   History reviewed. No pertinent surgical history.     Home Medications    Prior to Admission medications   Medication Sig Start Date End Date Taking? Authorizing Provider  amoxicillin-clavulanate (AUGMENTIN) 875-125 MG tablet Take 1 tablet by mouth 2 (two) times daily. 04/28/15   Deno Etienne, DO  benzonatate (TESSALON) 100 MG capsule Take 1 capsule (100 mg total) by mouth every 8 (eight) hours. 04/28/15   Deno Etienne, DO  Cholecalciferol (VITAMIN D3) 2000 UNITS capsule Take 2,000 Units by mouth daily.    [provider]  diazepam (VALIUM) 5 MG tablet Take 1 tablet (5 mg total) by mouth every 6 (six) hours as needed for muscle spasms. 06/23/14   Debby Freiberg, MD  enalapril (VASOTEC) 5 MG tablet Take 5 mg by mouth 2 (two) times daily.    [provider]  fenofibrate (TRICOR) 145 MG tablet Take 145 mg by mouth daily.    [provider]  glimepiride (AMARYL) 1 MG tablet Take 1 mg by mouth daily with supper.    [provider]  halobetasol (ULTRAVATE) 0.05 % cream Apply 1 application topically 2 (two) times daily as needed (itching).    [provider]  levothyroxine (LEVOXYL) 75 MCG  tablet Take 75 mcg by mouth daily before breakfast.    [provider]  loratadine (CLARITIN) 10 MG tablet Take 1 tablet (10 mg total) by mouth daily as needed for allergies. Patient not taking: Reported on 06/23/2014 01/05/13   Virgel Manifold, MD  metFORMIN (GLUCOPHAGE) 850 MG tablet Take 850 mg by mouth 2 (two) times daily with a meal.    [provider]  methocarbamol (ROBAXIN) 500 MG tablet Take 1 tablet (500 mg total) by mouth 2 (two) times daily. 06/25/14   Tanna Furry, MD  methylPREDNISolone (MEDROL DOSEPAK) 4 MG TBPK tablet 6 po on day 1, decrease by 1 tab per day 06/25/14   Tanna Furry, MD  naproxen (NAPROSYN) 500 MG tablet Take 1 tablet (500 mg total) by mouth 2 (two) times daily. 06/25/14   Tanna Furry, MD  oxyCODONE-acetaminophen (PERCOCET/ROXICET) 5-325 MG per tablet Take 2 tablets by mouth every 4 (four) hours as needed. 06/25/14   Tanna Furry, MD  pioglitazone (ACTOS) 15 MG tablet Take 15 mg by mouth every morning.    [provider]  predniSONE (DELTASONE) 20 MG tablet 2 tabs po daily x 4 days 04/28/15   Deno Etienne, DO  simvastatin (ZOCOR) 20 MG tablet Take 20 mg by mouth every evening.    [provider]  tamsulosin (FLOMAX) 0.4 MG CAPS capsule Take 0.4 mg by mouth daily.  [provider]  Vedolizumab (ENTYVIO IV) Inject into the vein. Every 8 weeks, next dose due on the 27th. Patient is not sure what dose the medication is.    [provider]    Family History History reviewed. No pertinent family history.  Social History Social History  Substance Use Topics  . Smoking status: Former Research scientist (life sciences)  . Smokeless tobacco: Not on file  . Alcohol use 1.2 oz/week    2 Shots of liquor per week     Allergies   Keflex [cephalexin]   Review of Systems Review of Systems  All other systems reviewed and are negative.    Physical Exam Updated Vital Signs BP (!) 163/102 (BP Location: Left Arm)   Pulse 62   Temp 98.7 F (37.1 C)  (Oral)   Resp 16   Ht 1.702 m (5' 7" )   Wt 88.5 kg (195 lb)   SpO2 98%   BMI 30.54 kg/m   Physical Exam  Constitutional: He is oriented to person, place, and time. He appears well-developed and well-nourished.  Non-toxic appearance. No distress.  HENT:  Head: Normocephalic and atraumatic.  Eyes: Pupils are equal, round, and reactive to light. Conjunctivae, EOM and lids are normal.  Neck: Normal range of motion. Neck supple. No tracheal deviation present. No thyroid mass present.  Cardiovascular: Normal rate, regular rhythm and normal heart sounds.  Exam reveals no gallop.   No murmur heard. Pulmonary/Chest: Effort normal and breath sounds normal. No stridor. No respiratory distress. He has no decreased breath sounds. He has no wheezes. He has no rhonchi. He has no rales.  Abdominal: Soft. Normal appearance and bowel sounds are normal. He exhibits no distension. There is no tenderness. There is no rebound and no CVA tenderness.  Musculoskeletal: Normal range of motion. He exhibits no edema.       Thoracic back: He exhibits tenderness. He exhibits no bony tenderness and no swelling.       Back:  Neurological: He is alert and oriented to person, place, and time. He has normal strength. No cranial nerve deficit or sensory deficit. GCS eye subscore is 4. GCS verbal subscore is 5. GCS motor subscore is 6.  Skin: Skin is warm and dry. No abrasion and no rash noted.  Psychiatric: He has a normal mood and affect. His speech is normal and behavior is normal.  Nursing note and vitals reviewed.    ED Treatments / Results  Labs (all labs ordered are listed, but only abnormal results are displayed) Labs Reviewed  BASIC METABOLIC PANEL - Abnormal; Notable for the following:       Result Value   CO2 18 (*)    Glucose, Bld 115 (*)    BUN 24 (*)    Creatinine, Ser 1.25 (*)    GFR calc non Af Amer 53 (*)    All other components within normal limits  CBC  I-STAT TROPONIN, ED    EKG  EKG  Interpretation  Date/Time:  Wednesday August 21 2016 14:07:07 EDT Ventricular Rate:  80 PR Interval:  222 QRS Duration: 92 QT Interval:  382 QTC Calculation: 440 R Axis:   66 Text Interpretation:  Sinus rhythm with 1st degree A-V block Otherwise normal ECG No significant change since last tracing Confirmed by Lacretia Leigh ([id number]) on 08/21/2016 6:25:51 PM       Radiology Dg Chest 2 View  Result Date: 08/21/2016 CLINICAL DATA:  Right-sided chest discomfort with pleuritic component and persistent cough since a fall  three days ago. History of hypertension, diabetes, former smoker. EXAM: CHEST  2 VIEW COMPARISON:  PA and lateral chest x-ray of April 28, 2015 FINDINGS: The lungs are adequately inflated. The interstitial markings are coarse though stable. There is no alveolar infiltrate, pleural effusion, or pneumothorax. The heart and pulmonary vascularity are normal. There is calcification in the wall of the aortic arch. The trachea is midline. There is calcification of the anterior longitudinal ligament of the thoracic spine and multilevel degenerative disc disease. There is an old posterior right fifth rib fracture. No acute rib fracture is observed. IMPRESSION: Chronic bronchitic changes, stable. No pneumonia nor CHF. No evidence of acute thoracic trauma. Old right posterior fifth rib fracture. Thoracic aortic atherosclerosis. Electronically Signed   By: David  Martinique M.D.   On: 08/21/2016 15:19    Procedures Procedures (including critical care time)  Medications Ordered in ED Medications - No data to display   Initial Impression / Assessment and Plan / ED Course  I have reviewed the triage vital signs and the nursing notes.  Pertinent labs & imaging results that were available during my care of the patient were reviewed by me and considered in my medical decision making (see chart for details).     Chest x-ray without acute findings. Labs are reassuring. Suspect muscle strain from  his fall. Stable for discharge  Final Clinical Impressions(s) / ED Diagnoses   Final diagnoses:  None    New Prescriptions New Prescriptions   No medications on file     Lacretia Leigh, MD 08/21/16 1843

## 2016-08-21 NOTE — ED Triage Notes (Signed)
Pt endorses falling on Sunday while trying to jump down in a hole. Pt denies dizziness. Since fall pt has had right sided chest pain and back pain, worse with movement. Pt has bruising to right leg and arm from fall. Denies cardiac hx. VSS

## 2016-09-03 ENCOUNTER — Other Ambulatory Visit (HOSPITAL_COMMUNITY): Payer: Self-pay | Admitting: *Deleted

## 2016-09-04 ENCOUNTER — Ambulatory Visit (HOSPITAL_COMMUNITY)
Admission: RE | Admit: 2016-09-04 | Discharge: 2016-09-04 | Disposition: A | Discharge status: Home / Self Care | Payer: Medicare Other | Source: Ambulatory Visit | Attending: Gastroenterology | Admitting: Gastroenterology

## 2016-09-04 DIAGNOSIS — K51819 Other ulcerative colitis with unspecified complications: Secondary | ICD-10-CM | POA: Diagnosis present

## 2016-09-04 LAB — CBC
HCT: 41 % (ref 39.0–52.0)
HEMOGLOBIN: 13.8 g/dL (ref 13.0–17.0)
MCH: 31.7 pg (ref 26.0–34.0)
MCHC: 33.7 g/dL (ref 30.0–36.0)
MCV: 94.3 fL (ref 78.0–100.0)
PLATELETS: 220 10*3/uL (ref 150–400)
RBC: 4.35 MIL/uL (ref 4.22–5.81)
RDW: 13.4 % (ref 11.5–15.5)
WBC: 5.5 10*3/uL (ref 4.0–10.5)

## 2016-09-04 LAB — COMPREHENSIVE METABOLIC PANEL
ALT: 22 U/L (ref 17–63)
AST: 24 U/L (ref 15–41)
Albumin: 3.6 g/dL (ref 3.5–5.0)
Alkaline Phosphatase: 79 U/L (ref 38–126)
Anion gap: 5 (ref 5–15)
BUN: 21 mg/dL — ABNORMAL HIGH (ref 6–20)
CO2: 22 mmol/L (ref 22–32)
Calcium: 9 mg/dL (ref 8.9–10.3)
Chloride: 112 mmol/L — ABNORMAL HIGH (ref 101–111)
Creatinine, Ser: 1.13 mg/dL (ref 0.61–1.24)
GFR calc Af Amer: >60 mL/min (ref >60)
GFR calc non Af Amer: >60 mL/min (ref >60)
Glucose, Bld: 132 mg/dL — ABNORMAL HIGH (ref 65–99)
Potassium: 4.4 mmol/L (ref 3.5–5.1)
Sodium: 139 mmol/L (ref 135–145)
Total Bilirubin: 0.7 mg/dL (ref 0.3–1.2)
Total Protein: 6 g/dL — ABNORMAL LOW (ref 6.5–8.1)

## 2016-09-04 LAB — SEDIMENTATION RATE: Sed Rate: 5 mm/hr (ref 0–16)

## 2016-09-04 LAB — C-REACTIVE PROTEIN: CRP: <0.8 mg/dL (ref <1.0)

## 2016-09-04 MED ORDER — SODIUM CHLORIDE 0.9 % IV SOLN
INTRAVENOUS | Status: AC
  Administered 2016-09-04: 09:00:00 via INTRAVENOUS

## 2016-09-04 MED ORDER — DIPHENHYDRAMINE HCL 50 MG/ML IJ SOLN: 25.0000 mg | Freq: Once | INTRAMUSCULAR | Status: DC

## 2016-09-04 MED ORDER — VEDOLIZUMAB 300 MG IV SOLR
300.0000 mg | INTRAVENOUS | Status: AC
  Administered 2016-09-04: 300 mg via INTRAVENOUS
  Filled 2016-09-04: qty 5

## 2016-09-04 MED ORDER — ACETAMINOPHEN 325 MG PO TABS
650.0000 mg | ORAL_TABLET | Freq: Once | ORAL | Status: AC
  Administered 2016-09-04: 650 mg via ORAL

## 2016-09-04 MED ORDER — ACETAMINOPHEN 325 MG PO TABS
ORAL_TABLET | ORAL | Status: AC
  Filled 2016-09-04: qty 2

## 2017-07-16 ENCOUNTER — Encounter: Payer: Self-pay | Admitting: Family Medicine

## 2017-07-16 ENCOUNTER — Ambulatory Visit (INDEPENDENT_AMBULATORY_CARE_PROVIDER_SITE_OTHER): Payer: Medicare Other | Admitting: Family Medicine

## 2017-07-16 VITALS — BP 140/56 | Ht 67.0 in | Wt 193.0 lb

## 2017-07-16 DIAGNOSIS — M25551 Pain in right hip: Secondary | ICD-10-CM

## 2017-07-16 MED ORDER — NITROGLYCERIN 0.2 MG/HR TD PT24: MEDICATED_PATCH | TRANSDERMAL | 1 refills | Status: AC

## 2017-07-16 NOTE — Patient Instructions (Signed)
You have proximal hamstring tendinopathy. Start physical therapy to include modalities at their discretion (ultrasound, stim, ionto, possible dry needling). Nitro patches 1/4th patch to affected area, change daily. Follow up with me in 1 month to 6 weeks. If you're not improving as expected would go ahead with MRI of your right hip as next step.

## 2017-07-17 ENCOUNTER — Other Ambulatory Visit: Payer: Self-pay

## 2017-07-17 ENCOUNTER — Ambulatory Visit: Payer: Medicare Other | Attending: Family Medicine | Admitting: Physical Therapy

## 2017-07-17 ENCOUNTER — Encounter: Payer: Self-pay | Admitting: Physical Therapy

## 2017-07-17 DIAGNOSIS — M25551 Pain in right hip: Secondary | ICD-10-CM | POA: Diagnosis present

## 2017-07-17 DIAGNOSIS — M25651 Stiffness of right hip, not elsewhere classified: Secondary | ICD-10-CM | POA: Diagnosis present

## 2017-07-17 DIAGNOSIS — M6281 Muscle weakness (generalized): Secondary | ICD-10-CM

## 2017-07-17 NOTE — Therapy (Signed)
[place], [place], [address] Phone: [phone]   Fax:  [phone]  Physical Therapy Evaluation  Patient Details  Name: Patrick Carson MRN: [id number] Date of Birth: September 20, 1935 Referring Provider: Dr Barbaraann Barthel    Encounter Date: 07/17/2017  PT End of Session - 07/17/17 1348    Visit Number  1    Number of Visits  12    Date for PT Re-Evaluation  08/28/17    PT Start Time  1145    PT Stop Time  1230    PT Time Calculation (min)  45 min    Activity Tolerance  Patient tolerated treatment well    Behavior During Therapy  [facility] for tasks assessed/performed       Past Medical History:  Diagnosis Date  . Colitis   . Diabetes mellitus without complication ([place])   . Hypertension   . Stomach ulcer     History reviewed. No pertinent surgical history.  There were no vitals filed for this visit.   Subjective Assessment - 07/17/17 1154    Subjective  Patient reports with chronic pain in Rt proximal hamstring for about 5 mos.  He has had this issue in 2016 and at that time it involved his lumbar spine as well. This time he has no back pain.  PT back at that time did not help and he has also had 7 visits prior to coming to [facility].  He lives in [place] but arrived to [facility] 07/02/17.   He currently has difficulty mostly with sitting, but also with walking longer periods. Prior to this he was walking 3-4 times per week. He seeks care to ease pain and improve mobility.     Pertinent History  bilateral TKA, back pain, vascular insufficiency , colitis, diabetes, HTN    Limitations  Sitting;Lifting;Standing;Walking;House hold activities    How long can you sit comfortably?  20 min driving , hard surfaces are very limited    How long can you walk comfortably?  Has not done much since this flare up    Diagnostic tests  MRI of Lspine in [place] , unavailable for review, none for MRI     Patient Stated Goals  Reduce pain    Currently  in Pain?  Yes    Pain Score  4     Pain Location  Hip    Pain Orientation  Right    Pain Descriptors / Indicators  Aching    Pain Type  Chronic pain    Pain Radiating Towards  post thigh     Pain Onset  More than a month ago    Pain Frequency  Intermittent    Aggravating Factors   sitting    Pain Relieving Factors  laying flat, changing positions, does not use ice/heat , in the past stretching was of no help (1-2 mos ago)     Effect of Pain on Daily Activities  limits physical activity     Multiple Pain Sites  No         [facility] PT Assessment - 07/17/17 0001      Assessment   Medical Diagnosis  R hamstring (hip)     Referring Provider  Dr Barbaraann Barthel     Onset Date/Surgical Date  -- 5 mos     Prior Therapy  Yes without benefit       Precautions   Precautions  None      Restrictions   Weight Bearing Restrictions  No  Balance Screen   Has the patient fallen in the past 6 months  No      [place] residence    Additional Comments  Lives in [place], staying in [place] for the summer with a friend       Prior Function   Level of Independence  Independent    Vocation  Retired    Chief Executive Officer       Cognition   Overall Cognitive Status  Within Functional Limits for tasks assessed      Observation/Other Assessments   Focus on Therapeutic Outcomes (FOTO)   technical issues      Sensation   Light Touch  Appears Intact      Posture/Postural Control   Posture/Postural Control  Postural limitations    Postural Limitations  Rounded Shoulders;Forward head;Decreased lumbar lordosis;Increased thoracic kyphosis;Posterior pelvic tilt;Flexed trunk      AROM   Right Knee Flexion  90    Lumbar Flexion  50% with pain post thigh     Lumbar Extension  25% no pain increase     Lumbar - Right Side Bend  stiff    Lumbar - Left Side Bend  stiff, min motion noted     Lumbar - Right Rotation  50% no pain     Lumbar - Left Rotation  50%  no pain       PROM   Overall PROM Comments  pain with Rt SLR post knee       Strength   Right Hip Flexion  4/5    Right Hip ABduction  3-/5    Left Hip Flexion  3+/5    Right Knee Flexion  4/5 pain     Right Knee Extension  4+/5    Left Knee Flexion  5/5    Left Knee Extension  5/5      Flexibility   Hamstrings  tight , approx. 45 deg R       Palpation   Palpation comment  tender proximal hamstring attachment          Objective measurements completed on examination: See above findings.       PT Education - 07/17/17 1348    Education Details  PT/POC, dry needling, HEP, MHP vs ice , eval findings     Person(s) Educated  Patient    Methods  Explanation;Handout    Comprehension  Verbalized understanding;Need further instruction          PT Long Term Goals - 07/17/17 1349      PT LONG TERM GOAL #1   Title  Pt will be I with HEP for easing pain in Rt hip, strength and flexibility.     Time  6    Period  Weeks    Status  New    Target Date  08/28/17      PT LONG TERM GOAL #2   Title  Pt will be able to drive 20 min or more without increased hip pain 75% of the time.     Time  6    Period  Weeks    Status  New    Target Date  08/28/17      PT LONG TERM GOAL #3   Title  Pt will be able to walk for 30 min without increasing Rt hip pain for fitness, mobility.     Time  6    Period  Weeks    Status  New    Target Date  08/28/17      PT LONG TERM GOAL #4   Title  Pt will be able to demo good posture, body mechanics for ADLs, transfers consistently to reduce reinjury.     Time  6    Period  Weeks    Status  New    Target Date  08/28/17             Plan - 07/17/17 1353    Clinical Impression Statement  Pt presents for mod complexity eval of Rt hip/hamstring pain which is chronic but has flared up over the past few months.  The pain no longer involves his lumbar spine.  But it does limit his ability to walk, standing and sit comfortably in community  settings. He is skeptical about the prognosis as PT has not helped in the past but encouraged him to stay optimistic about what we can offer.       History and Personal Factors relevant to plan of care:  Rt knee AROM (TKA) , obesity, vascular disease, DM, HTN    Clinical Presentation  Evolving    Clinical Presentation due to:  changing symptoms    Clinical Decision Making  Moderate    Rehab Potential  Good    PT Frequency  2x / week    PT Duration  6 weeks    PT Treatment/Interventions  ADLs/Self Care Home Management;Electrical Stimulation;Functional mobility training;Ultrasound;Traction;Moist Heat;Gait training;Therapeutic exercise;Patient/family education;Iontophoresis 21m/ml Dexamethasone;Therapeutic activities;Dry needling;Manual techniques;Passive range of motion;Taping;Neuromuscular re-education    PT Next Visit Plan  check HEP, hip strength as tolerated, Dry needling, hamstring, piriformis, modalities for pain     PT Home Exercise Plan  piriform and hamstring stretch     Consulted and Agree with Plan of Care  Patient       Patient will benefit from skilled therapeutic intervention in order to improve the following deficits and impairments:  Hypomobility, Obesity, Pain, Postural dysfunction, Impaired UE functional use, Increased fascial restricitons, Decreased strength, Decreased range of motion, Difficulty walking, Decreased mobility, Improper body mechanics, Impaired flexibility  Visit Diagnosis: Pain in right hip  Muscle weakness (generalized)  Stiffness of right hip, not elsewhere classified     Problem List There are no active problems to display for this patient.   Rhyatt Muska 07/17/2017, 2:15 PM  [place] [address]Phone: [phone]  Fax:  [phone] Name: RTion TseMRN: [id number] of Birth: 104-16-1937 JRaeford Razor PT 07/17/17 2:15 PM Phone: [phone]:  [phone]

## 2017-07-18 ENCOUNTER — Encounter: Payer: Self-pay | Admitting: Family Medicine

## 2017-07-18 DIAGNOSIS — M25551 Pain in right hip: Secondary | ICD-10-CM | POA: Insufficient documentation

## 2017-07-18 NOTE — Progress Notes (Signed)
PCP: Janece Canterbury, MD  Subjective:   HPI: Patient is a 82 y.o. male here for right hip pain.  Patient reports about 3 years ago he had problems with low back where he could hardly move. Had epidurals, pain completely resolved. Then about 5 months ago he developed pain in posterior right buttock/hip area. Problems, sharp pain with prolonged sitting. Saw the doctor who did his epidurals, had 2 of these but didn't help with his pain. Given an ischial bursa injection which also didn't help. Has tried tramadol, 7 sessions of PT, sitting with towel on opposite side. No skin changes, numbness.  Past Medical History:  Diagnosis Date  . Colitis   . Diabetes mellitus without complication ([place])   . Hypertension   . Stomach ulcer     Current Outpatient Medications on File Prior to Visit  Medication Sig Dispense Refill  . glimepiride (AMARYL) 1 MG tablet Take 1 mg by mouth daily with supper.    . halobetasol (ULTRAVATE) 0.05 % cream Apply 1 application topically 2 (two) times daily as needed (itching).    Marland Kitchen levothyroxine (LEVOXYL) 75 MCG tablet Take 75 mcg by mouth daily before breakfast.    . losartan (COZAAR) 50 MG tablet Take 50 mg by mouth daily.    . metFORMIN (GLUCOPHAGE) 850 MG tablet Take 850 mg by mouth 2 (two) times daily with a meal.    . pioglitazone (ACTOS) 15 MG tablet Take 15 mg by mouth every morning.    . simvastatin (ZOCOR) 20 MG tablet Take 20 mg by mouth every evening.    . Vedolizumab (ENTYVIO IV) Inject into the vein. Every 8 weeks, next dose due on the 27th. Patient is not sure what dose the medication is.     No current facility-administered medications on file prior to visit.     History reviewed. No pertinent surgical history.  Allergies  Allergen Reactions  . Keflex [Cephalexin] Itching    Social History   Socioeconomic History  . Marital status: Single    Spouse name: Not on file  . Number of children: Not on file  . Years of education: Not on file   . Highest education level: Not on file  Occupational History  . Not on file  Social Needs  . Financial resource strain: Not on file  . Food insecurity:    Worry: Not on file    Inability: Not on file  . Transportation needs:    Medical: Not on file    Non-medical: Not on file  Tobacco Use  . Smoking status: Former Research scientist (life sciences)  . Smokeless tobacco: Never Used  Substance and Sexual Activity  . Alcohol use: Yes    Alcohol/week: 1.2 oz    Types: 2 Shots of liquor per week  . Drug use: No  . Sexual activity: Not on file  Lifestyle  . Physical activity:    Days per week: Not on file    Minutes per session: Not on file  . Stress: Not on file  Relationships  . Social connections:    Talks on phone: Not on file    Gets together: Not on file    Attends religious service: Not on file    Active member of club or organization: Not on file    Attends meetings of clubs or organizations: Not on file    Relationship status: Not on file  . Intimate partner violence:    Fear of current or ex partner: Not on file  Emotionally abused: Not on file    Physically abused: Not on file    Forced sexual activity: Not on file  Other Topics Concern  . Not on file  Social History Narrative  . Not on file    History reviewed. No pertinent family history.  BP (!) 140/56   Ht 5' 7"  (1.702 m)   Wt 193 lb (87.5 kg)   BMI 30.23 kg/m   Review of Systems: See HPI above.     Objective:  Physical Exam:  Gen: NAD, comfortable in exam room  Back: No gross deformity, scoliosis. TTP proximal medial hamstring tendon.  No midline or bony TTP. FROM. Strength LEs 5/5 all muscle groups except knee flexion at 30 degrees.   Trace MSRs in patellar and achilles tendons, equal bilaterally. Negative SLRs. Sensation intact to light touch bilaterally.  Right hip: No deformity. FROM with 5/5 strength except 5-/5 knee flexion at 30 degrees. No tenderness to palpation aside from proximal medial hamstring  tendon. NVI distally. Negative logroll Negative fabers and piriformis stretches.   Assessment & Plan:  1. Right hip pain - exam is reassuring.  S/p trial of 2 epidurals for his lumbar spondylosis without benefit.  Ischial bursa injection also without benefit.  He did physical therapy but focus more on stretching, some strengthening.  Will start physical therapy again and nitro patches (discussed risks of headache, skin irritation).  F/u in 1 month to 6 weeks.  Consider MRI of right hip if not improving.

## 2017-07-18 NOTE — Assessment & Plan Note (Signed)
exam is reassuring.  S/p trial of 2 epidurals for his lumbar spondylosis without benefit.  Ischial bursa injection also without benefit.  He did physical therapy but focus more on stretching, some strengthening.  Will start physical therapy again and nitro patches (discussed risks of headache, skin irritation).  F/u in 1 month to 6 weeks.  Consider MRI of right hip if not improving.

## 2017-07-21 ENCOUNTER — Ambulatory Visit: Payer: Medicare Other | Admitting: Physical Therapy

## 2017-07-21 DIAGNOSIS — M25551 Pain in right hip: Secondary | ICD-10-CM

## 2017-07-21 DIAGNOSIS — M6281 Muscle weakness (generalized): Secondary | ICD-10-CM

## 2017-07-21 DIAGNOSIS — M25651 Stiffness of right hip, not elsewhere classified: Secondary | ICD-10-CM

## 2017-07-21 NOTE — Therapy (Signed)
[place], [place], [address] Phone: [phone]   Fax:  [phone]  Physical Therapy Treatment  Patient Details  Name: Patrick Carson MRN: [id number] Date of Birth: 1935-03-27 Referring Provider: Dr Barbaraann Barthel    Encounter Date: 07/21/2017  PT End of Session - 07/21/17 0944    Visit Number  2    Number of Visits  12    Date for PT Re-Evaluation  08/28/17    PT Start Time  0932    PT Stop Time  1014    PT Time Calculation (min)  42 min    Activity Tolerance  Patient tolerated treatment well    Behavior During Therapy  [facility] for tasks assessed/performed       Past Medical History:  Diagnosis Date  . Colitis   . Diabetes mellitus without complication ([place])   . Hypertension   . Stomach ulcer     No past surgical history on file.  There were no vitals filed for this visit.  Subjective Assessment - 07/21/17 0942    Subjective  Had a hard time with some yardwork this weekend.      Currently in Pain?  Yes    Pain Score  4     Pain Location  Buttocks    Pain Orientation  Right    Pain Descriptors / Indicators  Aching    Pain Type  Chronic pain    Pain Onset  More than a month ago    Pain Frequency  Intermittent    Aggravating Factors   sitting    Pain Relieving Factors  stretching          [facility] PT Assessment - 07/21/17 0001      Strength   Right Hip Extension  3+/5    Left Hip Extension  3+/5        [facility] Adult PT Treatment/Exercise - 07/21/17 0001      Self-Care   Other Self-Care Comments   trigger points, soft tissue work, pain vs pressure/soreness       Lumbar Exercises: Aerobic   Nustep  5 min UE and LE L5       Knee/Hip Exercises: Stretches   Active Hamstring Stretch  Right;Left;2 reps    Piriformis Stretch  Right;Left;2 reps      Manual Therapy   Manual Therapy  Joint mobilization;Soft tissue mobilization;Myofascial release;Passive ROM    Manual therapy comments  done on L  hip briefly limited by tight ant hip and bilateral knee flexion limits    Joint Mobilization  Rt hip Er/IR with compression, lateral SI border     Soft tissue mobilization  L lumbar, glute med (sore) , piriformis and hamstring.  Pain Rt lateral hamtring, used caution to avoid over working.      Myofascial Release  Rt hip, thigh     Passive ROM  Rt> L hip              PT Education - 07/21/17 0943    Education Details  importance of stretching     Person(s) Educated  Patient    Methods  Explanation    Comprehension  Verbalized understanding          PT Long Term Goals - 07/17/17 1349      PT LONG TERM GOAL #1   Title  Pt will be I with HEP for easing pain in Rt hip, strength and flexibility.     Time  6  Period  Weeks    Status  New    Target Date  08/28/17      PT LONG TERM GOAL #2   Title  Pt will be able to drive 20 min or more without increased hip pain 75% of the time.     Time  6    Period  Weeks    Status  New    Target Date  08/28/17      PT LONG TERM GOAL #3   Title  Pt will be able to walk for 30 min without increasing Rt hip pain for fitness, mobility.     Time  6    Period  Weeks    Status  New    Target Date  08/28/17      PT LONG TERM GOAL #4   Title  Pt will be able to demo good posture, body mechanics for ADLs, transfers consistently to reduce reinjury.     Time  6    Period  Weeks    Status  New    Target Date  08/28/17            Plan - 07/21/17 1255    Clinical Impression Statement  Patient with less pain post session.  Focused on manual only to see if we could impact his pain with standing, functional movement post session.  Tightness anterior hip, weak in hip extensors.  Lateral hamstring painful and ropey, used gentle pressure, manual and IASTM. Interested in dry needling.     PT Treatment/Interventions  ADLs/Self Care Home Management;Electrical Stimulation;Functional mobility training;Ultrasound;Traction;Moist Heat;Gait  training;Therapeutic exercise;Patient/family education;Iontophoresis 79m/ml Dexamethasone;Therapeutic activities;Dry needling;Manual techniques;Passive range of motion;Taping;Neuromuscular re-education    PT Next Visit Plan  check HEP, hip strength as tolerated, Dry needling, hamstring, piriformis, modalities for pain . How did manual help?     PT Home Exercise Plan  piriform and hamstring stretch     Consulted and Agree with Plan of Care  Patient       Patient will benefit from skilled therapeutic intervention in order to improve the following deficits and impairments:  Hypomobility, Obesity, Pain, Postural dysfunction, Impaired UE functional use, Increased fascial restricitons, Decreased strength, Decreased range of motion, Difficulty walking, Decreased mobility, Improper body mechanics, Impaired flexibility  Visit Diagnosis: Pain in right hip  Muscle weakness (generalized)  Stiffness of right hip, not elsewhere classified     Problem List Patient Active Problem List   Diagnosis Date Noted  . Right hip pain 07/18/2017  . Primary hypothyroidism 01/28/2016  . Primary osteoarthritis of left knee 01/28/2016  . Type 2 diabetes mellitus without complication ([place] 062/86/3817 . [facility] ([facility] 10/18/2014    PAA,JENNIFER 07/21/2017, 12:59 PM  [facility] [place] [address]Phone: [phone]  Fax:  [phone] Name: RBrittany AmiraultMRN: [id number] of Birth: 1September 23, 1937  JRaeford Razor PT 07/21/17 12:59 PM Phone: [phone]: [phone]

## 2017-07-24 ENCOUNTER — Encounter: Payer: Self-pay | Admitting: Physical Therapy

## 2017-07-24 ENCOUNTER — Ambulatory Visit: Payer: Medicare Other | Admitting: Physical Therapy

## 2017-07-24 DIAGNOSIS — M25651 Stiffness of right hip, not elsewhere classified: Secondary | ICD-10-CM

## 2017-07-24 DIAGNOSIS — M6281 Muscle weakness (generalized): Secondary | ICD-10-CM

## 2017-07-24 DIAGNOSIS — M25551 Pain in right hip: Secondary | ICD-10-CM

## 2017-07-24 NOTE — Therapy (Signed)
[place], [place], [address] Phone: [phone]   Fax:  [phone]  Physical Therapy Treatment  Patient Details  Name: Patrick Carson MRN: [id number] Date of Birth: [birth year] Referring Provider: Dr Barbaraann Barthel    Encounter Date: 07/24/2017  PT End of Session - 07/24/17 1940    Visit Number  3    Number of Visits  12    Date for PT Re-Evaluation  08/28/17    PT Start Time  0801    PT Stop Time  8185    PT Time Calculation (min)  43 min    Activity Tolerance  Patient tolerated treatment well    Behavior During Therapy  [facility] for tasks assessed/performed       Past Medical History:  Diagnosis Date  . Colitis   . Diabetes mellitus without complication ([place])   . Hypertension   . Stomach ulcer     History reviewed. No pertinent surgical history.  There were no vitals filed for this visit.  Subjective Assessment - 07/24/17 0803    Subjective  Patient reports no real change. He felt good for a day or so after the last vist but he continues to have pain.     Pertinent History  bilateral TKA, back pain, vascular insufficiency , colitis, diabetes, HTN    Limitations  Sitting;Lifting;Standing;Walking;House hold activities    How long can you sit comfortably?  20 min driving , hard surfaces are very limited    How long can you walk comfortably?  Has not done much since this flare up    Diagnostic tests  MRI of Lspine in [place] , unavailable for review, none for MRI     Patient Stated Goals  Reduce pain    Currently in Pain?  Yes    Pain Score  4     Pain Orientation  Right    Pain Descriptors / Indicators  Aching    Pain Type  Chronic pain    Pain Onset  More than a month ago    Pain Frequency  Intermittent    Aggravating Factors   sitting, stretching     Pain Relieving Factors  stretching     Effect of Pain on Daily Activities  limits physical activity                        [facility]  Adult PT Treatment/Exercise - 07/24/17 0001      Lumbar Exercises: Aerobic   Nustep  5 min UE and LE L5       Knee/Hip Exercises: Stretches   Passive Hamstring Stretch  3 reps;20 seconds    Piriformis Stretch  Right;Left;2 reps      Manual Therapy   Manual therapy comments  skilled palpation of trigger points     Soft tissue mobilization  L lumbar, glute med (sore) , piriformis and hamstring.  Pain Rt lateral hamtring, used caution to avoid over working.  Used IASTYM to lateral hamstring muscles     Myofascial Release  right posteriro thigh     Passive ROM  right hip        Trigger Point Dry Needling - 07/24/17 1936    Consent Given?  Yes    Muscles Treated Lower Body  Hamstring    Hamstring Response  Twitch response elicited semi tendonosis and semi membrenosis            PT Education - 07/24/17 1940  Education Details  benefits and risks of TPDN     Person(s) Educated  Patient    Methods  Explanation;Demonstration;Tactile cues;Verbal cues;Handout    Comprehension  Verbalized understanding;Returned demonstration;Verbal cues required;Tactile cues required          PT Long Term Goals - 07/17/17 1349      PT LONG TERM GOAL #1   Title  Pt will be I with HEP for easing pain in Rt hip, strength and flexibility.     Time  6    Period  Weeks    Status  New    Target Date  08/28/17      PT LONG TERM GOAL #2   Title  Pt will be able to drive 20 min or more without increased hip pain 75% of the time.     Time  6    Period  Weeks    Status  New    Target Date  08/28/17      PT LONG TERM GOAL #3   Title  Pt will be able to walk for 30 min without increasing Rt hip pain for fitness, mobility.     Time  6    Period  Weeks    Status  New    Target Date  08/28/17      PT LONG TERM GOAL #4   Title  Pt will be able to demo good posture, body mechanics for ADLs, transfers consistently to reduce reinjury.     Time  6    Period  Weeks    Status  New    Target Date   08/28/17            Plan - 07/24/17 1940    Clinical Impression Statement  Patient is tolaerated trigger point dry needling well. Therapy needled 6 different trigger points up and down the lateral hamstring and middle hamstring area. The patient would like to stay with the dry needling. He will be seen next visit for dry needling and further visits if he sees a benefit.     Clinical Presentation  Evolving    Clinical Decision Making  Moderate    Rehab Potential  Good    PT Frequency  2x / week    PT Duration  6 weeks    PT Treatment/Interventions  ADLs/Self Care Home Management;Electrical Stimulation;Functional mobility training;Ultrasound;Traction;Moist Heat;Gait training;Therapeutic exercise;Patient/family education;Iontophoresis 85m/ml Dexamethasone;Therapeutic activities;Dry needling;Manual techniques;Passive range of motion;Taping;Neuromuscular re-education    PT Next Visit Plan  check HEP, hip strength as tolerated, Dry needling, hamstring, piriformis, modalities for pain . How did manual help?     PT Home Exercise Plan  piriform and hamstring stretch     Consulted and Agree with Plan of Care  Patient       Patient will benefit from skilled therapeutic intervention in order to improve the following deficits and impairments:  Hypomobility, Obesity, Pain, Postural dysfunction, Impaired UE functional use, Increased fascial restricitons, Decreased strength, Decreased range of motion, Difficulty walking, Decreased mobility, Improper body mechanics, Impaired flexibility  Visit Diagnosis: Pain in right hip  Muscle weakness (generalized)  Stiffness of right hip, not elsewhere classified     Problem List Patient Active Problem List   Diagnosis Date Noted  . Right hip pain 07/18/2017  . Primary hypothyroidism 01/28/2016  . Primary osteoarthritis of left knee 01/28/2016  . Type 2 diabetes mellitus without complication ([place] 053/61/4431 . Ulcerative colitis ([facility] 10/18/2014     DCarney LivingPT DPT  07/24/2017, 7:45 PM  [place], [place], [address] Phone: [phone]   Fax:  [phone]  Name: Merit Maybee MRN: [id number] Date of Birth: 1935/10/02

## 2017-07-31 ENCOUNTER — Ambulatory Visit: Payer: Medicare Other | Admitting: Physical Therapy

## 2017-07-31 ENCOUNTER — Encounter: Payer: Self-pay | Admitting: Physical Therapy

## 2017-07-31 DIAGNOSIS — M6281 Muscle weakness (generalized): Secondary | ICD-10-CM

## 2017-07-31 DIAGNOSIS — M25551 Pain in right hip: Secondary | ICD-10-CM

## 2017-07-31 DIAGNOSIS — M25651 Stiffness of right hip, not elsewhere classified: Secondary | ICD-10-CM

## 2017-07-31 NOTE — Therapy (Signed)
[place], [place], [address] Phone: [phone]   Fax:  [phone]  Physical Therapy Treatment  Patient Details  Name: Patrick Carson MRN: [id number] Date of Birth: 1935-01-11 Referring Provider: Dr Barbaraann Barthel    Encounter Date: 07/31/2017  PT End of Session - 07/31/17 1516    Visit Number  4    Number of Visits  12    Date for PT Re-Evaluation  08/28/17    PT Start Time  0730    PT Stop Time  0800    PT Time Calculation (min)  30 min    Activity Tolerance  Patient tolerated treatment well    Behavior During Therapy  [facility] for tasks assessed/performed       Past Medical History:  Diagnosis Date  . Colitis   . Diabetes mellitus without complication ([place])   . Hypertension   . Stomach ulcer     History reviewed. No pertinent surgical history.  There were no vitals filed for this visit.  Subjective Assessment - 07/31/17 0733    Subjective  Patient is having less pain in the hamstring but now he is having a feeling like he is sitting on a rock in his buttock and ichial area.     Pertinent History  bilateral TKA, back pain, vascular insufficiency , colitis, diabetes, HTN    Limitations  Sitting;Lifting;Standing;Walking;House hold activities    How long can you sit comfortably?  20 min driving , hard surfaces are very limited    How long can you walk comfortably?  Has not done much since this flare up    Diagnostic tests  MRI of Lspine in [place] , unavailable for review, none for MRI     Patient Stated Goals  Reduce pain    Currently in Pain?  Yes    Pain Score  5     Pain Location  Hip    Pain Orientation  Right    Pain Descriptors / Indicators  Aching    Pain Type  Chronic pain    Pain Onset  More than a month ago    Pain Frequency  Intermittent    Aggravating Factors   sitting and stretching     Pain Relieving Factors  stretching     Effect of Pain on Daily Activities  limit Physical  activity     Multiple Pain Sites  No                       [facility] Adult PT Treatment/Exercise - 07/31/17 0001      Knee/Hip Exercises: Stretches   Passive Hamstring Stretch  3 reps;20 seconds    Piriformis Stretch  Right;Left;2 reps    Other Knee/Hip Stretches  single knee to chest stretch 3x20 sec hold       Manual Therapy   Manual therapy comments  skilled palpation of trigger points     Soft tissue mobilization  L lumbar, glute med (sore) , piriformis and hamstring.  Pain Rt lateral hamtring, used caution to avoid over working.  Used IASTYM to lateral hamstring muscles     Myofascial Release  right posteriro thigh     Passive ROM  right hip              PT Education - 07/31/17 1515    Education Details  benefits and risks of tpdn     Person(s) Educated  Patient    Methods  Explanation;Demonstration;Tactile cues;Verbal  cues;Handout    Comprehension  Returned demonstration;Tactile cues required;Verbal cues required;Verbalized understanding;Need further instruction          PT Long Term Goals - 07/31/17 1519      PT LONG TERM GOAL #1   Title  Pt will be I with HEP for easing pain in Rt hip, strength and flexibility.     Time  6    Period  Weeks    Status  On-going      PT LONG TERM GOAL #2   Title  Pt will be able to drive 20 min or more without increased hip pain 75% of the time.     Time  6    Period  Weeks    Status  On-going      PT LONG TERM GOAL #3   Title  Pt will be able to walk for 30 min without increasing Rt hip pain for fitness, mobility.     Time  6    Period  Weeks    Status  On-going      PT LONG TERM GOAL #4   Title  Pt will be able to demo good posture, body mechanics for ADLs, transfers consistently to reduce reinjury.     Time  6    Period  Weeks    Status  On-going            Plan - 07/31/17 1516    Clinical Impression Statement  Patient tolerated needling well. He had a good twitch in his piriformis and his  proximal hamstring. Therapy will re-assess on Monday the benefit of nedling. Therapy perrfromed manual therapy on his proximal hamstring. He was also shown a few stretches but he did not respond that well to them.     History and Personal Factors relevant to plan of care:  right knee ROM, obesity, vascualr disease     Clinical Presentation  Evolving    Clinical Decision Making  Moderate    Rehab Potential  Good    PT Frequency  2x / week    PT Duration  6 weeks    PT Treatment/Interventions  ADLs/Self Care Home Management;Electrical Stimulation;Functional mobility training;Ultrasound;Traction;Moist Heat;Gait training;Therapeutic exercise;Patient/family education;Iontophoresis 45m/ml Dexamethasone;Therapeutic activities;Dry needling;Manual techniques;Passive range of motion;Taping;Neuromuscular re-education    PT Next Visit Plan  check HEP, hip strength as tolerated, Dry needling, hamstring, piriformis, modalities for pain . How did manual help?     PT Home Exercise Plan  piriform and hamstring stretch     Consulted and Agree with Plan of Care  Patient       Patient will benefit from skilled therapeutic intervention in order to improve the following deficits and impairments:  Hypomobility, Obesity, Pain, Postural dysfunction, Impaired UE functional use, Increased fascial restricitons, Decreased strength, Decreased range of motion, Difficulty walking, Decreased mobility, Improper body mechanics, Impaired flexibility  Visit Diagnosis: Pain in right hip  Muscle weakness (generalized)  Stiffness of right hip, not elsewhere classified     Problem List Patient Active Problem List   Diagnosis Date Noted  . Right hip pain 07/18/2017  . Primary hypothyroidism 01/28/2016  . Primary osteoarthritis of left knee 01/28/2016  . Type 2 diabetes mellitus without complication ([place] 092/01/69 . Ulcerative colitis ([place] 10/18/2014    DCarney LivingPT DPT  07/31/2017, 3:24 PM  [facility] [address]Phone: [phone]  Fax:  [phone] Name: Patrick StroheckerMRN: [id number]  of Birth: [birth year]

## 2017-08-04 ENCOUNTER — Encounter: Payer: Self-pay | Admitting: Physical Therapy

## 2017-08-04 ENCOUNTER — Ambulatory Visit: Payer: Medicare Other | Admitting: Physical Therapy

## 2017-08-04 ENCOUNTER — Other Ambulatory Visit (HOSPITAL_COMMUNITY): Payer: Self-pay

## 2017-08-04 DIAGNOSIS — M25551 Pain in right hip: Secondary | ICD-10-CM

## 2017-08-04 DIAGNOSIS — M6281 Muscle weakness (generalized): Secondary | ICD-10-CM

## 2017-08-04 DIAGNOSIS — M25651 Stiffness of right hip, not elsewhere classified: Secondary | ICD-10-CM

## 2017-08-04 NOTE — Therapy (Addendum)
[place], [place], [address] Phone: [phone]   Fax:  [phone]  Physical Therapy Treatment/Discharge   Patient Details  Name: Patrick Carson MRN: [id number] Date of Birth: 1935/05/04 Referring Provider: Dr Barbaraann Barthel    Encounter Date: 08/04/2017  PT End of Session - 08/04/17 1523    Visit Number  5    Number of Visits  12    Date for PT Re-Evaluation  08/28/17    PT Start Time  1330    PT Stop Time  1411    PT Time Calculation (min)  41 min    Activity Tolerance  Patient tolerated treatment well    Behavior During Therapy  [facility] for tasks assessed/performed       Past Medical History:  Diagnosis Date  . Colitis   . Diabetes mellitus without complication ([place])   . Hypertension   . Stomach ulcer     History reviewed. No pertinent surgical history.  There were no vitals filed for this visit.  Subjective Assessment - 08/04/17 1334    Subjective  Patient reports no real difference. He contoinues to feel like he is ssitting on something. He was able to walk this weekend but also has anterior hip pain.     Pertinent History  bilateral TKA, back pain, vascular insufficiency , colitis, diabetes, HTN    Limitations  Sitting;Lifting;Standing;Walking;House hold activities    How long can you sit comfortably?  20 min driving , hard surfaces are very limited    How long can you walk comfortably?  Has not done much since this flare up    Diagnostic tests  MRI of Lspine in [place] , unavailable for review, none for MRI     Patient Stated Goals  Reduce pain    Pain Score  5     Pain Location  Hip    Pain Orientation  Right    Pain Descriptors / Indicators  Aching    Pain Type  Chronic pain    Pain Onset  More than a month ago    Pain Frequency  Constant    Aggravating Factors   sitting and stretching     Pain Relieving Factors  stretching     Effect of Pain on Daily Activities  limit physcisal  activity                        [facility] Adult PT Treatment/Exercise - 08/04/17 0001      Knee/Hip Exercises: Stretches   Estate manager/land agent Limitations  thomas stretch 3x20 sec hold     Piriformis Stretch  Right;Left;2 reps    Other Knee/Hip Stretches  single knee to chest stretch 3x20 sec hold     Other Knee/Hip Stretches  trunk rotation       Manual Therapy   Manual therapy comments  skilled palpation of trigger points     Soft tissue mobilization  L lumbar, glute med (sore) , piriformis and hamstring.  Pain Rt lateral hamtring, used caution to avoid over working.  Used IASTYM to lateral hamstring muscles     Myofascial Release  right posteriro thigh     Passive ROM  right hip        Trigger Point Dry Needling - 08/04/17 1539    Consent Given?  Yes    Muscles Treated Lower Body  Hamstring;Piriformis    Piriformis Response  Twitch response elicited  Hamstring Response  Twitch response elicited           PT Education - 08/04/17 1522    Education Details  Benefits and risks of TPDN and updated HEP for stretches     Person(s) Educated  Patient    Methods  Explanation;Demonstration;Tactile cues;Verbal cues    Comprehension  Verbalized understanding;Returned demonstration;Tactile cues required;Verbal cues required          PT Long Term Goals - 07/31/17 1519      PT LONG TERM GOAL #1   Title  Pt will be I with HEP for easing pain in Rt hip, strength and flexibility.     Time  6    Period  Weeks    Status  On-going      PT LONG TERM GOAL #2   Title  Pt will be able to drive 20 min or more without increased hip pain 75% of the time.     Time  6    Period  Weeks    Status  On-going      PT LONG TERM GOAL #3   Title  Pt will be able to walk for 30 min without increasing Rt hip pain for fitness, mobility.     Time  6    Period  Weeks    Status  On-going      PT LONG TERM GOAL #4   Title  Pt will be able to demo good posture,  body mechanics for ADLs, transfers consistently to reduce reinjury.     Time  6    Period  Weeks    Status  On-going            Plan - 08/04/17 1526    Clinical Impression Statement  Patient has not made much progress. He continues to have significant pain when he is sitting. He reported no improvement with needling. He was given an update of stretches. He reported pain in his anterior hip at times. He was given a thomas stretch. If the patient feels an improvement he may benefit from further therapy. He will likley discharge to HEP and be sent back to the MD for further evaluation.     Clinical Presentation  Evolving    Clinical Decision Making  Moderate    Rehab Potential  Good    PT Frequency  2x / week    PT Duration  6 weeks    PT Treatment/Interventions  ADLs/Self Care Home Management;Electrical Stimulation;Functional mobility training;Ultrasound;Traction;Moist Heat;Gait training;Therapeutic exercise;Patient/family education;Iontophoresis 56m/ml Dexamethasone;Therapeutic activities;Dry needling;Manual techniques;Passive range of motion;Taping;Neuromuscular re-education    PT Next Visit Plan  check HEP, hip strength as tolerated, Dry needling, hamstring, piriformis, modalities for pain . How did manual help?     PT Home Exercise Plan  piriform and hamstring stretch     Consulted and Agree with Plan of Care  Patient       Patient will benefit from skilled therapeutic intervention in order to improve the following deficits and impairments:  Hypomobility, Obesity, Pain, Postural dysfunction, Impaired UE functional use, Increased fascial restricitons, Decreased strength, Decreased range of motion, Difficulty walking, Decreased mobility, Improper body mechanics, Impaired flexibility  Visit Diagnosis: Pain in right hip  Muscle weakness (generalized)  Stiffness of right hip, not elsewhere classified   PHYSICAL THERAPY DISCHARGE SUMMARY  Visits from Start of Care: 5   Current  functional level related to goals / functional outcomes: Continues to have pain when sitting    Remaining deficits: Continued pain  sitting    Education / Equipment: HEP  Plan: Patient agrees to discharge.  Patient goals were not met. Patient is being discharged due to lack of progress.  ?????       Problem List Patient Active Problem List   Diagnosis Date Noted  . Right hip pain 07/18/2017  . Primary hypothyroidism 01/28/2016  . Primary osteoarthritis of left knee 01/28/2016  . Type 2 diabetes mellitus without complication ([place]) 35/70/1779  . Ulcerative colitis ([place]) 10/18/2014    Carney Living PT DPT  08/04/2017, 3:40 PM 10/28/2017  [facility] [address] [place], [place], [address] Phone: [phone]   Fax:  [phone]  Name: Jamil Armwood MRN: [id number] Date of Birth: [birth year]

## 2017-08-05 ENCOUNTER — Ambulatory Visit (HOSPITAL_COMMUNITY)
Admission: RE | Admit: 2017-08-05 | Discharge: 2017-08-05 | Disposition: A | Discharge status: Home / Self Care | Payer: Medicare Other | Source: Ambulatory Visit | Attending: Gastroenterology | Admitting: Gastroenterology

## 2017-08-05 DIAGNOSIS — K5180 Other ulcerative colitis without complications: Secondary | ICD-10-CM | POA: Diagnosis present

## 2017-08-05 LAB — CBC WITH DIFFERENTIAL/PLATELET
ABS IMMATURE GRANULOCYTES: 0 10*3/uL (ref 0.0–0.1)
Basophils Absolute: 0.1 10*3/uL (ref 0.0–0.1)
Basophils Relative: 1 %
EOS PCT: 7 %
Eosinophils Absolute: 0.4 10*3/uL (ref 0.0–0.7)
HEMATOCRIT: 45.4 % (ref 39.0–52.0)
HEMOGLOBIN: 15 g/dL (ref 13.0–17.0)
IMMATURE GRANULOCYTES: 0 %
LYMPHS ABS: 0.8 10*3/uL (ref 0.7–4.0)
LYMPHS PCT: 14 %
MCH: 32.8 pg (ref 26.0–34.0)
MCHC: 33 g/dL (ref 30.0–36.0)
MCV: 99.3 fL (ref 78.0–100.0)
Monocytes Absolute: 0.8 10*3/uL (ref 0.1–1.0)
Monocytes Relative: 13 %
NEUTROS ABS: 3.7 10*3/uL (ref 1.7–7.7)
NEUTROS PCT: 65 %
Platelets: 249 10*3/uL (ref 150–400)
RBC: 4.57 MIL/uL (ref 4.22–5.81)
RDW: 12.8 % (ref 11.5–15.5)
WBC: 5.7 10*3/uL (ref 4.0–10.5)

## 2017-08-05 LAB — COMPREHENSIVE METABOLIC PANEL
ALBUMIN: 3.9 g/dL (ref 3.5–5.0)
ALK PHOS: 82 U/L (ref 38–126)
ALT: 19 U/L (ref 0–44)
AST: 21 U/L (ref 15–41)
Anion gap: 9 (ref 5–15)
BUN: 24 mg/dL — ABNORMAL HIGH (ref 8–23)
CALCIUM: 9.4 mg/dL (ref 8.9–10.3)
CHLORIDE: 108 mmol/L (ref 98–111)
CO2: 23 mmol/L (ref 22–32)
CREATININE: 1.13 mg/dL (ref 0.61–1.24)
GFR calc Af Amer: >60 mL/min (ref >60)
GFR calc non Af Amer: 60 mL/min — ABNORMAL LOW (ref >60)
GLUCOSE: 139 mg/dL — AB (ref 70–99)
Potassium: 4.3 mmol/L (ref 3.5–5.1)
Sodium: 140 mmol/L (ref 135–145)
Total Bilirubin: 1 mg/dL (ref 0.3–1.2)
Total Protein: 6.8 g/dL (ref 6.5–8.1)

## 2017-08-05 LAB — SEDIMENTATION RATE: Sed Rate: 12 mm/hr (ref 0–16)

## 2017-08-05 LAB — C-REACTIVE PROTEIN: CRP: <0.8 mg/dL (ref <1.0)

## 2017-08-05 MED ORDER — ACETAMINOPHEN 325 MG PO TABS: 650.0000 mg | ORAL_TABLET | Freq: Once | ORAL | Status: DC

## 2017-08-05 MED ORDER — SODIUM CHLORIDE 0.9 % IV SOLN
INTRAVENOUS | Status: DC
  Administered 2017-08-05: 09:00:00 via INTRAVENOUS

## 2017-08-05 MED ORDER — VEDOLIZUMAB 300 MG IV SOLR
300.0000 mg | INTRAVENOUS | Status: DC
  Administered 2017-08-05: 300 mg via INTRAVENOUS
  Filled 2017-08-05: qty 5

## 2017-08-05 MED ORDER — DIPHENHYDRAMINE HCL 50 MG/ML IJ SOLN: 25.0000 mg | Freq: Once | INTRAMUSCULAR | Status: DC

## 2017-08-06 ENCOUNTER — Ambulatory Visit: Payer: Medicare Other | Admitting: Physical Therapy

## 2017-08-12 ENCOUNTER — Encounter: Payer: Medicare Other | Admitting: Physical Therapy

## 2017-08-14 ENCOUNTER — Encounter: Payer: Medicare Other | Admitting: Physical Therapy

## 2017-08-20 ENCOUNTER — Ambulatory Visit (INDEPENDENT_AMBULATORY_CARE_PROVIDER_SITE_OTHER): Payer: Medicare Other | Admitting: Family Medicine

## 2017-08-20 ENCOUNTER — Encounter: Payer: Self-pay | Admitting: Family Medicine

## 2017-08-20 VITALS — BP 128/80 | Ht 67.0 in | Wt 193.0 lb

## 2017-08-20 DIAGNOSIS — M25551 Pain in right hip: Secondary | ICD-10-CM | POA: Diagnosis present

## 2017-08-20 NOTE — Progress Notes (Signed)
PCP: Janece Canterbury, MD  Subjective:   HPI: Patient is a 82 y.o. male here for right hip pain.  7/10: Patient reports about 3 years ago he had problems with low back where he could hardly move. Had epidurals, pain completely resolved. Then about 5 months ago he developed pain in posterior right buttock/hip area. Problems, sharp pain with prolonged sitting. Saw the doctor who did his epidurals, had 2 of these but didn't help with his pain. Given an ischial bursa injection which also didn't help. Has tried tramadol, 7 sessions of PT, sitting with towel on opposite side. No skin changes, numbness.  8/14: Patient reports he has not noticed much change in his right posterior proximal hamstring pain despite physical therapy, dry needling. He states he had 3 total visits a dry needling.  The physical therapy seemed to be more irritating. He did not start nitro patches as it was difficult to cut these into fourths. Pain is worse with sitting and can be severe. Also worse at the beginning of his walks but eases up as he gets moving. No numbness or radiation.  Past Medical History:  Diagnosis Date  . Colitis   . Diabetes mellitus without complication ([place])   . Hypertension   . Stomach ulcer     Current Outpatient Medications on File Prior to Visit  Medication Sig Dispense Refill  . fluorouracil (EFUDEX) 5 % cream PLEASE SEE ATTACHED FOR DETAILED DIRECTIONS    . glimepiride (AMARYL) 1 MG tablet Take 1 mg by mouth daily with supper.    . halobetasol (ULTRAVATE) 0.05 % cream Apply 1 application topically 2 (two) times daily as needed (itching).    Marland Kitchen levothyroxine (LEVOXYL) 75 MCG tablet Take 75 mcg by mouth daily before breakfast.    . losartan (COZAAR) 50 MG tablet Take 50 mg by mouth daily.    . metFORMIN (GLUCOPHAGE) 850 MG tablet Take 850 mg by mouth 2 (two) times daily with a meal.    . pioglitazone (ACTOS) 15 MG tablet Take 15 mg by mouth every morning.    . simvastatin (ZOCOR) 20  MG tablet Take 20 mg by mouth every evening.    . Vedolizumab (ENTYVIO IV) Inject into the vein. Every 8 weeks, next dose due on the 27th. Patient is not sure what dose the medication is.    . nitroGLYCERIN (NITRODUR - DOSED IN MG/24 HR) 0.2 mg/hr patch Apply 1/4th patch to affected area, change daily (Patient not taking: Reported on 08/20/2017) 30 patch 1   No current facility-administered medications on file prior to visit.     History reviewed. No pertinent surgical history.  Allergies  Allergen Reactions  . Keflex [Cephalexin] Itching    Social History   Socioeconomic History  . Marital status: Single    Spouse name: Not on file  . Number of children: Not on file  . Years of education: Not on file  . Highest education level: Not on file  Occupational History  . Not on file  Social Needs  . Financial resource strain: Not on file  . Food insecurity:    Worry: Not on file    Inability: Not on file  . Transportation needs:    Medical: Not on file    Non-medical: Not on file  Tobacco Use  . Smoking status: Former Research scientist (life sciences)  . Smokeless tobacco: Never Used  Substance and Sexual Activity  . Alcohol use: Yes    Alcohol/week: 2.0 standard drinks    Types: 2  Shots of liquor per week  . Drug use: No  . Sexual activity: Not on file  Lifestyle  . Physical activity:    Days per week: Not on file    Minutes per session: Not on file  . Stress: Not on file  Relationships  . Social connections:    Talks on phone: Not on file    Gets together: Not on file    Attends religious service: Not on file    Active member of club or organization: Not on file    Attends meetings of clubs or organizations: Not on file    Relationship status: Not on file  . Intimate partner violence:    Fear of current or ex partner: Not on file    Emotionally abused: Not on file    Physically abused: Not on file    Forced sexual activity: Not on file  Other Topics Concern  . Not on file  Social History  Narrative  . Not on file    History reviewed. No pertinent family history.  BP 128/80   Ht 5' 7"  (1.702 m)   Wt 193 lb (87.5 kg)   BMI 30.23 kg/m   Review of Systems: See HPI above.     Objective:  Physical Exam:  Gen: NAD, comfortable in exam room  Back: No gross deformity, scoliosis. No paraspinal TTP.  No midline or bony TTP. FROM. Strength LEs 5/5 all muscle groups.   Negative SLRs. Sensation intact to light touch bilaterally.  Right hip: No deformity. FROM with 5/5 strength except 5-/5 knee flexion at 30 degrees. TTP proximal hamstring tendon and ischial tuberosity. NVI distally. Negative logroll Negative fabers and piriformis stretches.   Assessment & Plan:  1. Right hip pain -exam again reassuring.  He unfortunately is not improving with physical therapy, dry needling.  Prior to this he had tried 2 epidurals for his lumbar spondylosis without benefit and he also had an initial bursa injection without benefit.  He is just had one visit of Rolfing and he will see if this gives him some benefit.  He was advised to try the full Nitropatch since he has had difficulty cutting this in the fourths that we discussed increased risk of headache with this dose.  Call us about 3 weeks if not improving and we will go ahead with an MRI without contrast of the right hip.

## 2017-08-20 NOTE — Patient Instructions (Signed)
Continue with the rolfing and see if you get benefit with this after a few sessions. Try the full nitro patch and change daily. Call me in about 3 weeks if you're not improving - next step would be to do an MRI of your hip. It's not unusual for this to take several months to resolve but we should be seeing progress.

## 2017-09-30 ENCOUNTER — Encounter (HOSPITAL_COMMUNITY): Payer: Medicare Other

## 2018-03-11 IMAGING — DX DG CHEST 2V
2 series · 2 of 2 positions shown · non-contrast
Comparison: PA and lateral chest x-ray April 28, 2015

CLINICAL DATA: Right-sided chest discomfort with pleuritic
component and persistent cough since a fall three days ago. History
of hypertension, diabetes, former smoker.

EXAM:
CHEST  2 VIEW

[chest pa]
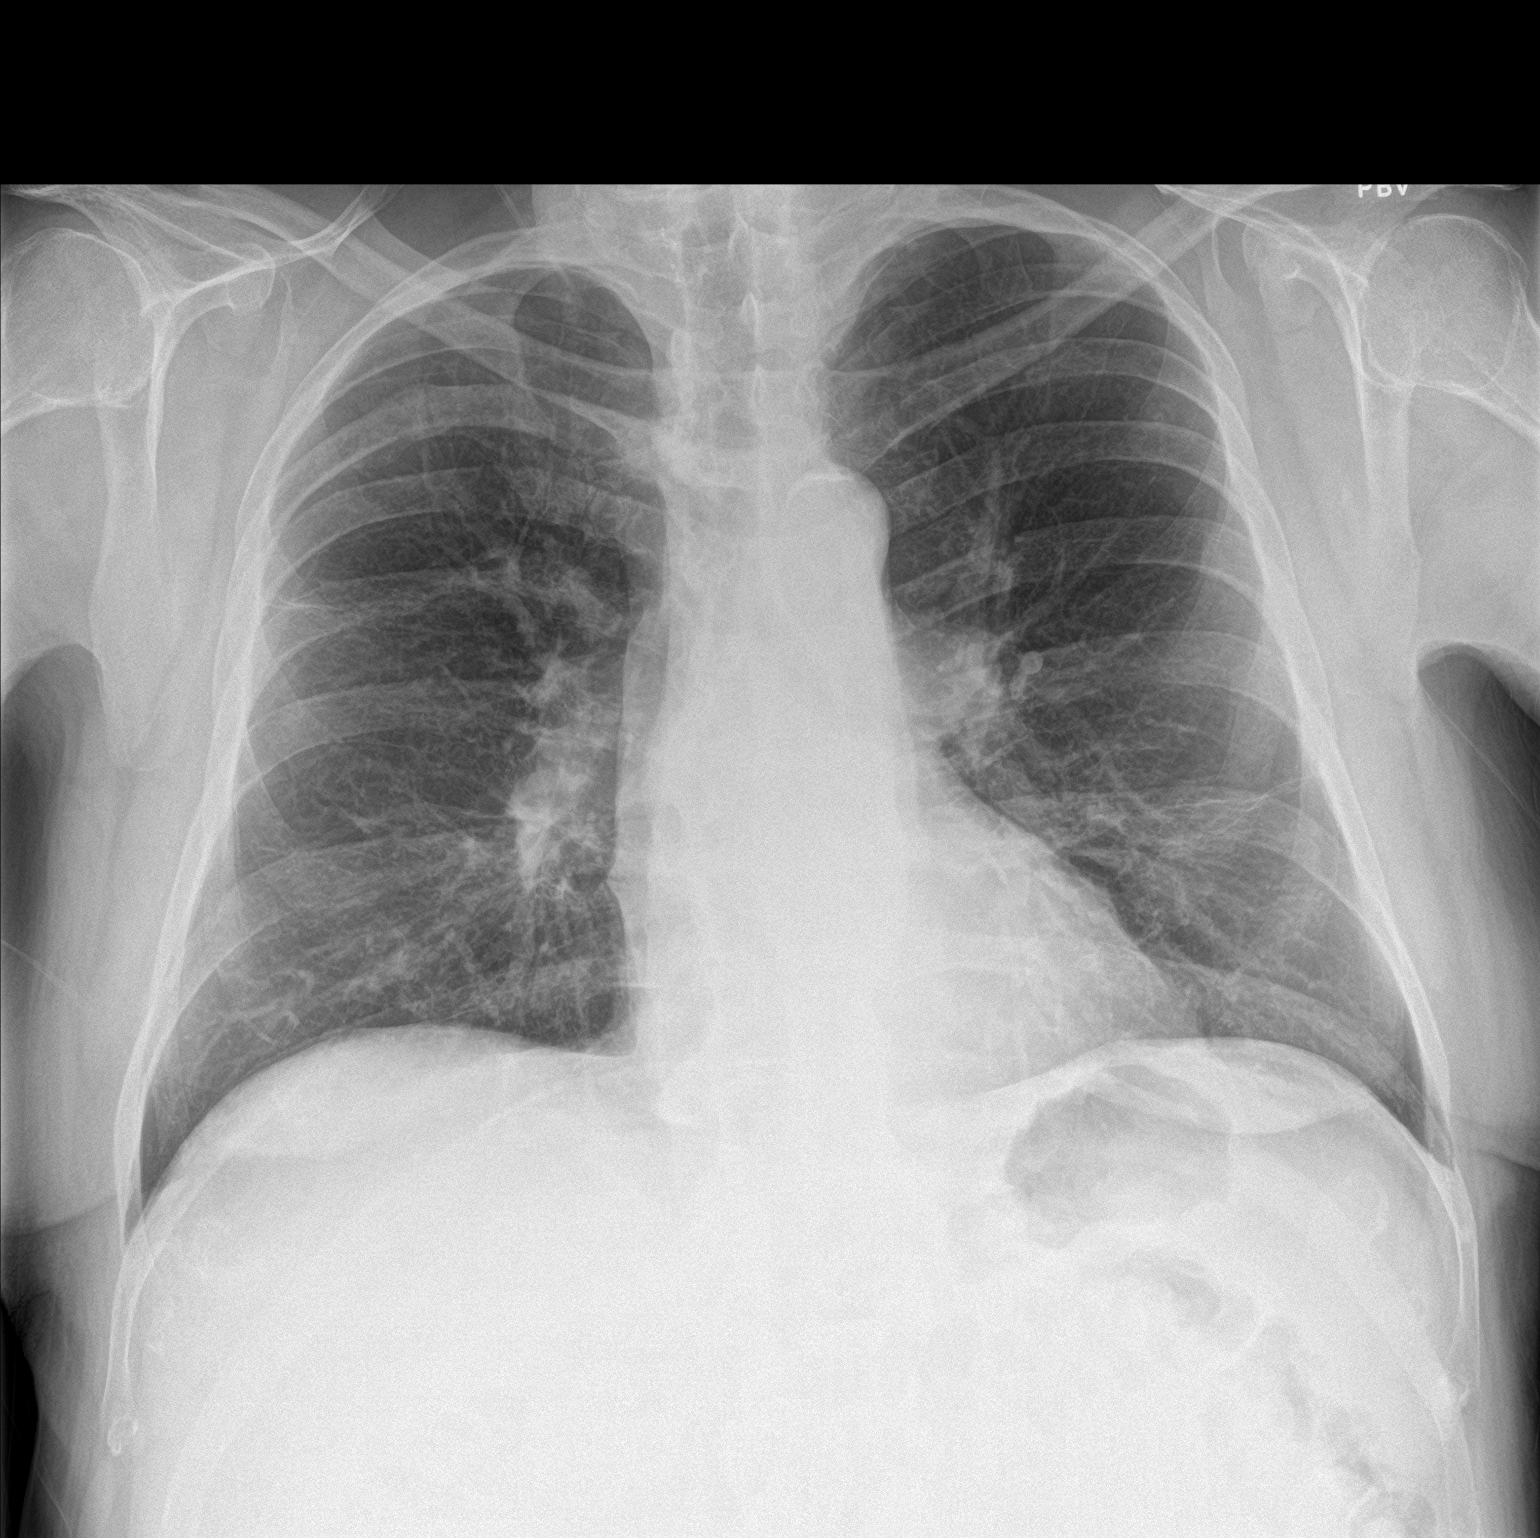

[chest lat]
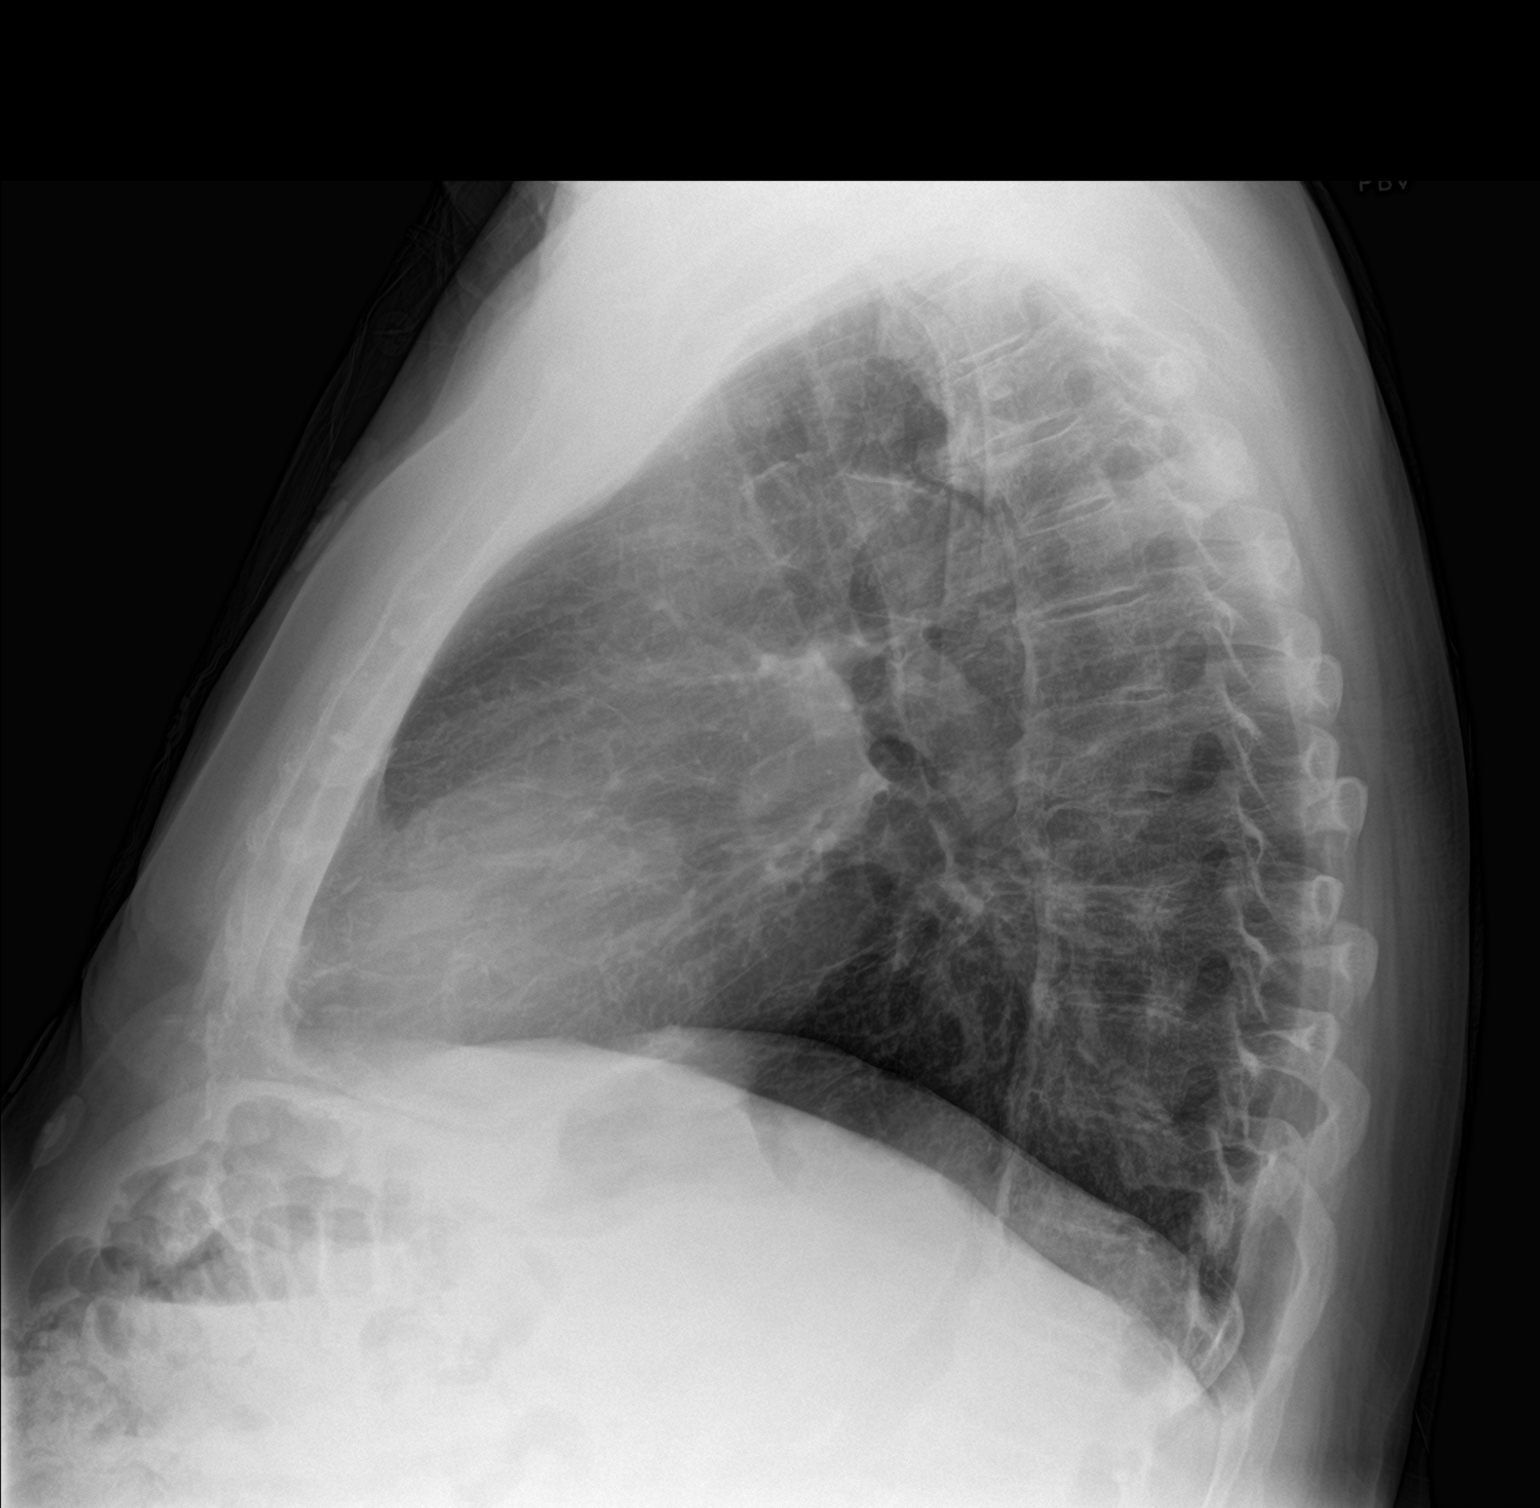

[2 of 2 positions shown; findings below may reference images not displayed]

FINDINGS: The lungs are adequately inflated. The interstitial markings are
coarse though stable. There is no alveolar infiltrate, pleural
effusion, or pneumothorax. The heart and pulmonary vascularity are
normal. There is calcification in the wall of the aortic arch. The
trachea is midline. There is calcification of the anterior
longitudinal ligament of the thoracic spine and multilevel
degenerative disc disease. There is an old posterior right fifth rib
fracture. No acute rib fracture is observed.
IMPRESSION: Chronic bronchitic changes, stable. No pneumonia nor CHF. No
evidence of acute thoracic trauma. Old right posterior fifth rib
fracture.

Thoracic aortic atherosclerosis.

## 2018-09-02 ENCOUNTER — Other Ambulatory Visit (HOSPITAL_COMMUNITY): Payer: Self-pay | Admitting: *Deleted

## 2018-09-03 ENCOUNTER — Other Ambulatory Visit: Payer: Self-pay

## 2018-09-03 ENCOUNTER — Ambulatory Visit (HOSPITAL_COMMUNITY)
Admission: RE | Admit: 2018-09-03 | Discharge: 2018-09-03 | Disposition: A | Discharge status: Home / Self Care | Payer: Medicare Other | Source: Ambulatory Visit | Attending: Gastroenterology | Admitting: Gastroenterology

## 2018-09-03 DIAGNOSIS — K51819 Other ulcerative colitis with unspecified complications: Secondary | ICD-10-CM | POA: Diagnosis present

## 2018-09-03 MED ORDER — VEDOLIZUMAB 300 MG IV SOLR
300.0000 mg | INTRAVENOUS | Status: DC
  Administered 2018-09-03: 300 mg via INTRAVENOUS
  Filled 2018-09-03: qty 5

## 2018-09-03 MED ORDER — DIPHENHYDRAMINE HCL 50 MG/ML IJ SOLN: 25.0000 mg | INTRAMUSCULAR | Status: DC

## 2018-09-03 MED ORDER — ACETAMINOPHEN 325 MG PO TABS: 650.0000 mg | ORAL_TABLET | ORAL | Status: DC

## 2018-10-29 ENCOUNTER — Other Ambulatory Visit: Payer: Self-pay

## 2018-10-29 ENCOUNTER — Encounter (HOSPITAL_COMMUNITY): Payer: Medicare Other

## 2018-10-29 ENCOUNTER — Ambulatory Visit (HOSPITAL_COMMUNITY)
Admission: RE | Admit: 2018-10-29 | Discharge: 2018-10-29 | Disposition: A | Discharge status: Home / Self Care | Payer: Medicare Other | Source: Ambulatory Visit | Attending: Gastroenterology | Admitting: Gastroenterology

## 2018-10-29 DIAGNOSIS — K5180 Other ulcerative colitis without complications: Secondary | ICD-10-CM | POA: Insufficient documentation

## 2018-10-29 MED ORDER — ACETAMINOPHEN 325 MG PO TABS: 650.0000 mg | ORAL_TABLET | ORAL | Status: DC

## 2018-10-29 MED ORDER — VEDOLIZUMAB 300 MG IV SOLR
300.0000 mg | INTRAVENOUS | Status: DC
  Administered 2018-10-29: 300 mg via INTRAVENOUS
  Filled 2018-10-29: qty 5

## 2018-10-29 MED ORDER — DIPHENHYDRAMINE HCL 50 MG/ML IJ SOLN: 25.0000 mg | INTRAMUSCULAR | Status: DC

## 2019-08-05 ENCOUNTER — Encounter (HOSPITAL_COMMUNITY): Payer: Medicare Other

## 2019-08-05 ENCOUNTER — Other Ambulatory Visit (HOSPITAL_COMMUNITY): Payer: Self-pay | Admitting: *Deleted

## 2019-08-06 ENCOUNTER — Other Ambulatory Visit: Payer: Self-pay

## 2019-08-06 ENCOUNTER — Encounter (HOSPITAL_COMMUNITY)
Admission: RE | Admit: 2019-08-06 | Discharge: 2019-08-06 | Disposition: A | Discharge status: Home / Self Care | Payer: Medicare Other | Source: Ambulatory Visit | Attending: Gastroenterology | Admitting: Gastroenterology

## 2019-08-06 DIAGNOSIS — K5190 Ulcerative colitis, unspecified, without complications: Secondary | ICD-10-CM | POA: Insufficient documentation

## 2019-08-06 MED ORDER — ACETAMINOPHEN 325 MG PO TABS: 650.0000 mg | ORAL_TABLET | ORAL | Status: DC

## 2019-08-06 MED ORDER — SODIUM CHLORIDE 0.9 % IV SOLN
300.0000 mg | INTRAVENOUS | Status: DC
  Administered 2019-08-06: 300 mg via INTRAVENOUS
  Filled 2019-08-06: qty 5

## 2019-08-06 MED ORDER — DIPHENHYDRAMINE HCL 50 MG/ML IJ SOLN: 25.0000 mg | INTRAMUSCULAR | Status: DC

## 2020-08-01 ENCOUNTER — Other Ambulatory Visit (HOSPITAL_COMMUNITY): Payer: Self-pay | Admitting: *Deleted

## 2020-08-03 ENCOUNTER — Ambulatory Visit (HOSPITAL_COMMUNITY)
Admission: RE | Admit: 2020-08-03 | Discharge: 2020-08-03 | Disposition: A | Discharge status: Home / Self Care | Payer: Medicare Other | Source: Ambulatory Visit | Attending: Gastroenterology | Admitting: Gastroenterology

## 2020-08-03 ENCOUNTER — Other Ambulatory Visit: Payer: Self-pay

## 2020-08-03 DIAGNOSIS — K5190 Ulcerative colitis, unspecified, without complications: Secondary | ICD-10-CM | POA: Insufficient documentation

## 2020-08-03 MED ORDER — VEDOLIZUMAB 300 MG IV SOLR
300.0000 mg | INTRAVENOUS | Status: DC
  Administered 2020-08-03: 300 mg via INTRAVENOUS
  Filled 2020-08-03: qty 5

## 2020-09-27 ENCOUNTER — Other Ambulatory Visit (HOSPITAL_COMMUNITY): Payer: Self-pay | Admitting: *Deleted

## 2020-09-28 ENCOUNTER — Ambulatory Visit (HOSPITAL_COMMUNITY)
Admission: RE | Admit: 2020-09-28 | Discharge: 2020-09-28 | Disposition: A | Discharge status: Home / Self Care | Payer: Medicare Other | Source: Ambulatory Visit | Attending: Gastroenterology | Admitting: Gastroenterology

## 2020-09-28 ENCOUNTER — Other Ambulatory Visit: Payer: Self-pay

## 2020-09-28 DIAGNOSIS — K5190 Ulcerative colitis, unspecified, without complications: Secondary | ICD-10-CM | POA: Insufficient documentation

## 2020-09-28 MED ORDER — VEDOLIZUMAB 300 MG IV SOLR
300.0000 mg | INTRAVENOUS | Status: DC
  Administered 2020-09-28: 300 mg via INTRAVENOUS
  Filled 2020-09-28: qty 5

## 2022-05-13 ENCOUNTER — Emergency Department (HOSPITAL_BASED_OUTPATIENT_CLINIC_OR_DEPARTMENT_OTHER): Payer: Medicare Other | Admitting: Radiology

## 2022-05-13 ENCOUNTER — Encounter (HOSPITAL_BASED_OUTPATIENT_CLINIC_OR_DEPARTMENT_OTHER): Payer: Self-pay

## 2022-05-13 ENCOUNTER — Other Ambulatory Visit: Payer: Self-pay

## 2022-05-13 ENCOUNTER — Emergency Department (HOSPITAL_BASED_OUTPATIENT_CLINIC_OR_DEPARTMENT_OTHER)
Admission: EM | Admit: 2022-05-13 | Discharge: 2022-05-13 | Disposition: A | Discharge status: Home / Self Care | Payer: Medicare Other | Attending: Emergency Medicine | Admitting: Emergency Medicine

## 2022-05-13 DIAGNOSIS — E119 Type 2 diabetes mellitus without complications: Secondary | ICD-10-CM | POA: Insufficient documentation

## 2022-05-13 DIAGNOSIS — E039 Hypothyroidism, unspecified: Secondary | ICD-10-CM | POA: Diagnosis not present

## 2022-05-13 DIAGNOSIS — M109 Gout, unspecified: Secondary | ICD-10-CM | POA: Insufficient documentation

## 2022-05-13 DIAGNOSIS — Z7984 Long term (current) use of oral hypoglycemic drugs: Secondary | ICD-10-CM | POA: Diagnosis not present

## 2022-05-13 DIAGNOSIS — Z7902 Long term (current) use of antithrombotics/antiplatelets: Secondary | ICD-10-CM | POA: Insufficient documentation

## 2022-05-13 DIAGNOSIS — R944 Abnormal results of kidney function studies: Secondary | ICD-10-CM | POA: Diagnosis not present

## 2022-05-13 DIAGNOSIS — M7989 Other specified soft tissue disorders: Secondary | ICD-10-CM | POA: Diagnosis present

## 2022-05-13 LAB — CBC WITH DIFFERENTIAL/PLATELET
Abs Immature Granulocytes: 0.02 10*3/uL (ref 0.00–0.07)
Basophils Absolute: 0 10*3/uL (ref 0.0–0.1)
Basophils Relative: 0 %
Eosinophils Absolute: 0.2 10*3/uL (ref 0.0–0.5)
Eosinophils Relative: 2 %
HCT: 38.9 % — ABNORMAL LOW (ref 39.0–52.0)
Hemoglobin: 12.9 g/dL — ABNORMAL LOW (ref 13.0–17.0)
Immature Granulocytes: 0 %
Lymphocytes Relative: 13 %
Lymphs Abs: 0.9 10*3/uL (ref 0.7–4.0)
MCH: 33.6 pg (ref 26.0–34.0)
MCHC: 33.2 g/dL (ref 30.0–36.0)
MCV: 101.3 fL — ABNORMAL HIGH (ref 80.0–100.0)
Monocytes Absolute: 1.6 10*3/uL — ABNORMAL HIGH (ref 0.1–1.0)
Monocytes Relative: 24 %
Neutro Abs: 4.2 10*3/uL (ref 1.7–7.7)
Neutrophils Relative %: 61 %
Platelets: 232 10*3/uL (ref 150–400)
RBC: 3.84 MIL/uL — ABNORMAL LOW (ref 4.22–5.81)
RDW: 13.5 % (ref 11.5–15.5)
WBC: 7 10*3/uL (ref 4.0–10.5)
nRBC: 0 % (ref 0.0–0.2)

## 2022-05-13 LAB — BASIC METABOLIC PANEL
Anion gap: 11 (ref 5–15)
BUN: 49 mg/dL — ABNORMAL HIGH (ref 8–23)
CO2: 21 mmol/L — ABNORMAL LOW (ref 22–32)
Calcium: 9.3 mg/dL (ref 8.9–10.3)
Chloride: 103 mmol/L (ref 98–111)
Creatinine, Ser: 1.81 mg/dL — ABNORMAL HIGH (ref 0.61–1.24)
GFR, Estimated: 36 mL/min — ABNORMAL LOW (ref >60)
Glucose, Bld: 170 mg/dL — ABNORMAL HIGH (ref 70–99)
Potassium: 4.1 mmol/L (ref 3.5–5.1)
Sodium: 135 mmol/L (ref 135–145)

## 2022-05-13 MED ORDER — PREDNISONE 10 MG PO TABS: 20.0000 mg | ORAL_TABLET | Freq: Every day | ORAL | 0 refills | Status: AC

## 2022-05-13 NOTE — ED Notes (Signed)
RN provided AVS using Teachback Method. Patient verbalizes understanding of Discharge Instructions. Opportunity for Questioning and Answers were provided by RN. Patient Discharged from ED ambulatory to Home with family.  

## 2022-05-13 NOTE — ED Provider Notes (Signed)
[place] EMERGENCY DEPARTMENT AT [facility] Provider Note   CSN: [id number] Arrival date & time: 05/13/22  1329     History  Chief Complaint  Patient presents with   Foot Swelling    Patrick Carson is a 87 y.o. male history of hypothyroidism, type 2 diabetes, UC presented with 3 days of right great toe pain.  Patient dates he has history of gout and this feels very similar to previous exacerbations and that last Friday his primary put him on colchicine which he has been taking and endorses symptom relief.  Patient states the swelling and erythema has gone down since taking the medication but still wanted to be evaluated in the ER.  Patient states has been able to walk and bear weight when initially the bedcovers would cause extreme levels of pain.   Patient denies any blood thinners, chest pain, shortness of breath, nausea/vomiting, fevers, recent illness, recent trauma, changes sensation/motor skills  Home Medications Prior to Admission medications   Medication Sig Start Date End Date Taking? Authorizing Provider  clopidogrel (PLAVIX) 75 MG tablet Take 75 mg by mouth daily. 03/27/22  Yes [provider]  ipratropium (ATROVENT) 0.03 % nasal spray Place 2 sprays into both nostrils 3 (three) times daily. 03/15/22  Yes [provider]  predniSONE (DELTASONE) 10 MG tablet Take 2 tablets (20 mg total) by mouth daily. 05/13/22  Yes Netta Corrigan, PA-C  tamsulosin (FLOMAX) 0.4 MG CAPS capsule Take 0.4 mg by mouth daily. 03/08/22  Yes [provider]  fluorouracil (EFUDEX) 5 % cream PLEASE SEE ATTACHED FOR DETAILED DIRECTIONS 01/30/17   [provider]  glimepiride (AMARYL) 1 MG tablet Take 1 mg by mouth daily with supper.    [provider]  halobetasol (ULTRAVATE) 0.05 % cream Apply 1 application topically 2 (two) times daily as needed (itching).    [provider]  levothyroxine (LEVOXYL) 75 MCG tablet Take 75 mcg by mouth daily  before breakfast.    [provider]  losartan (COZAAR) 50 MG tablet Take 50 mg by mouth daily. 07/24/16   [provider]  metFORMIN (GLUCOPHAGE) 850 MG tablet Take 850 mg by mouth 2 (two) times daily with a meal.    [provider]  nitroGLYCERIN (NITRODUR - DOSED IN MG/24 HR) 0.2 mg/hr patch Apply 1/4th patch to affected area, change daily Patient not taking: Reported on 08/20/2017 07/16/17   Lenda Kelp, MD  pioglitazone (ACTOS) 15 MG tablet Take 15 mg by mouth every morning.    [provider]  simvastatin (ZOCOR) 20 MG tablet Take 20 mg by mouth every evening.    [provider]  STELARA 90 MG/ML SOSY injection Inject into the skin.    [provider]  Vedolizumab (ENTYVIO IV) Inject into the vein. Every 8 weeks, next dose due on the 27th. Patient is not sure what dose the medication is.    [provider]      Allergies    Keflex [cephalexin]    Review of Systems   Review of Systems See HPI Physical Exam Updated Vital Signs BP (!) 150/72 (BP Location: Right Arm)   Pulse 79   Temp (!) 97.2 F (36.2 C)   Resp 16   Ht 5\' 7"  (1.702 m)   Wt 83.9 kg   SpO2 99%   BMI 28.97 kg/m  Physical Exam Constitutional:      General: He is not in acute distress. Cardiovascular:     Comments: 2+  bilateral dorsalis pedal pulses with regular rate Musculoskeletal:     Comments: Able to wiggle toes on right side No edema noted No step-off/crepitus/abnormalities palpated in right foot Soft compartments  Skin:    General: Skin is warm and dry.     Capillary Refill: Capillary refill takes less than 2 seconds.     Findings: Erythema (Over first through fourth MTP joints on right side) present.  Neurological:     Mental Status: He is alert.     Comments: Sensation intact distally     ED Results / Procedures / Treatments   Labs (all labs ordered are listed, but only abnormal results are displayed) Labs Reviewed  BASIC  METABOLIC PANEL - Abnormal; Notable for the following components:      Result Value   CO2 21 (*)    Glucose, Bld 170 (*)    BUN 49 (*)    Creatinine, Ser 1.81 (*)    GFR, Estimated 36 (*)    All other components within normal limits  CBC WITH DIFFERENTIAL/PLATELET - Abnormal; Notable for the following components:   RBC 3.84 (*)    Hemoglobin 12.9 (*)    HCT 38.9 (*)    MCV 101.3 (*)    Monocytes Absolute 1.6 (*)    All other components within normal limits    EKG None  Radiology DG Toe Great Right  Result Date: 05/13/2022 CLINICAL DATA:  Gout.  Swelling and pain EXAM: RIGHT GREAT TOE three views COMPARISON:  None Available. FINDINGS: No fracture or dislocation. Preserved joint spaces and bone mineralization. No larger erosive change with sclerotic rim overhanging edge. There are some soft tissue calcifications noted surrounding the area of the joint space at the interphalangeal joint of the great toe and along the first metatarsophalangeal joint. Please correlate for any soft tissue tophi. Mild soft tissue swelling IMPRESSION: Mild ill-defined soft tissue calcification. No erosive change. Soft tissue swelling. Electronically Signed   By: Karen Kays M.D.   On: 05/13/2022 14:34    Procedures Procedures    Medications Ordered in ED Medications - No data to display  ED Course/ Medical Decision Making/ A&P                             Medical Decision Making Amount and/or Complexity of Data Reviewed Radiology: ordered.   Coral Ceo 87 y.o. presented today for right great toe pain. Working DDx that I considered at this time includes, but not limited to, gout exacerbation, cellulitis, fracture, dislocation, neurovascular compromise, ischemic limb, septic arthritis, OA.  R/o DDx: cellulitis, fracture, dislocation, neurovascular compromise, ischemic limb, septic arthritis, OA: These are considered less likely due to history of present illness and physical exam  findings  Review of prior external notes: 03/07/2022 office visit  Unique Tests and My Interpretation:  Right great toe x-ray: No bony erosion noted, mild soft tissue swelling CBC: Unremarkable BMP: Elevated BUN, elevated creatinine from 11 days ago  Discussion with Independent Historian: None  Discussion of Management of Tests: None  Risk: Low: based on diagnostic testing/clinical impression and treatment plan  Risk Stratification Score: None  Staffed with Anitra Lauth, MD  Plan: Patient presented for right great toe pain. On exam patient was in no acute distress and stable vitals.  On exam patient did have erythema noted over the first through fourth MT P joints on his right foot however was neurovascularly intact with good pulse motor and sensation.  I  did not note any edema and patient stated that his symptoms have been getting better after starting colchicine on Friday.  X-ray will be obtained and patient denied wanting any pain meds.  Patient does have history of diabetes and UC and so will not give steroids or indomethacin and most likely encouraged him to continue his colchicine follow-up with primary care pending x-ray.  Patient's x-ray was unremarkable for any bony erosion or acute osseous changes.  Patient will have CBC and BMP repeated and if sugars will be given a short course of prednisone.  Patient stable at this time.  Patient's BMP showed that patient's renal function has decreased after being on the colchicine.  After discussion with Dr. Anitra Lauth we decided that patient will be placed on short course of steroids for his gout exacerbation.  I spoke to the patient about how he is to discontinue his colchicine as it is hurting his kidneys.  I spoke to the patient about the importance of monitoring his sugars daily as this will cause his sugars to increase.  I spoke to the patient about avoiding purine rich foods as this will exacerbate his symptoms.  All patient's questions were  answered to his satisfaction.  Patient was given return precautions. Patient stable for discharge at this time.  Patient verbalized understanding of plan.         Final Clinical Impression(s) / ED Diagnoses Final diagnoses:  Exacerbation of gout    Rx / DC Orders ED Discharge Orders          Ordered    predniSONE (DELTASONE) 10 MG tablet  Daily        05/13/22 1558              Netta Corrigan, PA-C 05/13/22 1600    Gwyneth Sprout, MD 05/14/22 1402

## 2022-05-13 NOTE — Discharge Instructions (Addendum)
Please follow-up with your primary care provider regarding recent symptoms and ER visit.  Please discontinue your colchicine as your kidney function has decreased while on this medication.  Please take the prednisone I prescribed for you and monitor your sugars daily as this will cause your sugars to elevate. Please read the attachment on purine rich foods to avoid as these will exacerbate your symptoms.  Today your x-ray did not show any fractures or bone erosion but just showed some swelling.  Please monitor your symptoms and if symptoms worsen please return to ER.

## 2022-05-13 NOTE — ED Triage Notes (Signed)
Pt c/o R foot pain & swelling. Reports hx gout, states foot is "very discolored- very swollen, been that way since Thursday evening." States he's usually very compliant w all prescription meds, did not take any this AM. Denies known fever, additional symptoms.
